# Patient Record
Sex: Female | Born: 1955 | Race: Black or African American | Hispanic: No | Marital: Married | Smoking: Former smoker
Health system: Southern US, Community
[De-identification: ages and names within clinical notes are randomized; demographics above are authoritative.]

## PROBLEM LIST (undated history)

## (undated) DIAGNOSIS — M1990 Unspecified osteoarthritis, unspecified site: Secondary | ICD-10-CM

## (undated) DIAGNOSIS — D649 Anemia, unspecified: Secondary | ICD-10-CM

## (undated) DIAGNOSIS — M719 Bursopathy, unspecified: Secondary | ICD-10-CM

## (undated) DIAGNOSIS — G4730 Sleep apnea, unspecified: Secondary | ICD-10-CM

## (undated) DIAGNOSIS — G629 Polyneuropathy, unspecified: Secondary | ICD-10-CM

## (undated) DIAGNOSIS — N939 Abnormal uterine and vaginal bleeding, unspecified: Secondary | ICD-10-CM

## (undated) DIAGNOSIS — IMO0001 Reserved for inherently not codable concepts without codable children: Secondary | ICD-10-CM

## (undated) DIAGNOSIS — E785 Hyperlipidemia, unspecified: Secondary | ICD-10-CM

## (undated) DIAGNOSIS — I10 Essential (primary) hypertension: Secondary | ICD-10-CM

## (undated) DIAGNOSIS — E119 Type 2 diabetes mellitus without complications: Secondary | ICD-10-CM

## (undated) DIAGNOSIS — I2510 Atherosclerotic heart disease of native coronary artery without angina pectoris: Secondary | ICD-10-CM

## (undated) DIAGNOSIS — E669 Obesity, unspecified: Secondary | ICD-10-CM

## (undated) DIAGNOSIS — M109 Gout, unspecified: Secondary | ICD-10-CM

## (undated) HISTORY — PX: TONSILLECTOMY: SUR1361

## (undated) HISTORY — PX: TUBAL LIGATION: SHX77

## (undated) HISTORY — PX: BREAST SURGERY: SHX581

## (undated) HISTORY — PX: BREAST BIOPSY: SHX20

## (undated) HISTORY — PX: DILATION AND CURETTAGE OF UTERUS: SHX78

---

## 2005-02-18 ENCOUNTER — Emergency Department: Payer: Self-pay | Admitting: Emergency Medicine

## 2005-04-04 ENCOUNTER — Emergency Department: Payer: Self-pay | Admitting: Emergency Medicine

## 2005-10-16 ENCOUNTER — Ambulatory Visit: Payer: Self-pay | Admitting: Internal Medicine

## 2006-01-29 ENCOUNTER — Ambulatory Visit: Payer: Self-pay | Admitting: Surgery

## 2006-10-27 ENCOUNTER — Other Ambulatory Visit: Payer: Self-pay

## 2006-10-27 ENCOUNTER — Emergency Department: Payer: Self-pay | Admitting: Emergency Medicine

## 2007-04-15 ENCOUNTER — Emergency Department: Payer: Self-pay | Admitting: Emergency Medicine

## 2007-05-07 ENCOUNTER — Emergency Department: Payer: Self-pay | Admitting: Unknown Physician Specialty

## 2008-03-12 ENCOUNTER — Emergency Department: Payer: Self-pay | Admitting: Emergency Medicine

## 2008-11-25 ENCOUNTER — Emergency Department: Payer: Self-pay | Admitting: Unknown Physician Specialty

## 2009-01-06 ENCOUNTER — Emergency Department: Payer: Self-pay | Admitting: Emergency Medicine

## 2009-03-30 ENCOUNTER — Ambulatory Visit: Payer: Self-pay

## 2009-10-21 ENCOUNTER — Inpatient Hospital Stay: Payer: Self-pay | Admitting: Internal Medicine

## 2010-04-13 ENCOUNTER — Ambulatory Visit: Payer: Self-pay | Admitting: Family Medicine

## 2011-04-16 ENCOUNTER — Emergency Department: Payer: Self-pay | Admitting: Emergency Medicine

## 2011-09-15 ENCOUNTER — Ambulatory Visit: Payer: Self-pay

## 2013-03-17 ENCOUNTER — Emergency Department: Payer: Self-pay | Admitting: Emergency Medicine

## 2013-03-17 LAB — BASIC METABOLIC PANEL
BUN: 20 mg/dL — ABNORMAL HIGH (ref 7–18)
Chloride: 104 mmol/L (ref 98–107)
Co2: 27 mmol/L (ref 21–32)
Creatinine: 0.95 mg/dL (ref 0.60–1.30)
EGFR (African American): >60
EGFR (Non-African Amer.): >60
Osmolality: 279 (ref 275–301)
Potassium: 3.6 mmol/L (ref 3.5–5.1)
Sodium: 138 mmol/L (ref 136–145)

## 2013-03-17 LAB — CBC
MCH: 22.8 pg — ABNORMAL LOW (ref 26.0–34.0)
MCHC: 32 g/dL (ref 32.0–36.0)
MCV: 71 fL — ABNORMAL LOW (ref 80–100)
Platelet: 301 10*3/uL (ref 150–440)
RBC: 4.56 10*6/uL (ref 3.80–5.20)

## 2013-06-05 ENCOUNTER — Ambulatory Visit: Payer: Self-pay | Admitting: Family Medicine

## 2014-02-04 ENCOUNTER — Ambulatory Visit: Payer: Self-pay | Admitting: Family Medicine

## 2014-11-09 ENCOUNTER — Ambulatory Visit: Payer: Self-pay | Admitting: Family Medicine

## 2014-11-27 ENCOUNTER — Ambulatory Visit: Payer: Self-pay | Admitting: Family Medicine

## 2015-07-20 ENCOUNTER — Ambulatory Visit
Admission: RE | Admit: 2015-07-20 | Discharge: 2015-07-20 | Disposition: A | Discharge status: Home / Self Care | Payer: BLUE CROSS/BLUE SHIELD | Source: Ambulatory Visit | Attending: Family Medicine | Admitting: Family Medicine

## 2015-07-20 ENCOUNTER — Other Ambulatory Visit: Payer: Self-pay | Admitting: Family Medicine

## 2015-07-20 DIAGNOSIS — M79605 Pain in left leg: Secondary | ICD-10-CM | POA: Insufficient documentation

## 2015-07-20 DIAGNOSIS — M25462 Effusion, left knee: Secondary | ICD-10-CM | POA: Insufficient documentation

## 2015-07-20 DIAGNOSIS — M179 Osteoarthritis of knee, unspecified: Secondary | ICD-10-CM | POA: Diagnosis not present

## 2015-11-19 ENCOUNTER — Encounter
Admission: RE | Admit: 2015-11-19 | Discharge: 2015-11-19 | Disposition: A | Discharge status: Home / Self Care | Payer: BLUE CROSS/BLUE SHIELD | Source: Ambulatory Visit | Attending: Obstetrics and Gynecology | Admitting: Obstetrics and Gynecology

## 2015-11-19 DIAGNOSIS — Z01810 Encounter for preprocedural cardiovascular examination: Secondary | ICD-10-CM | POA: Insufficient documentation

## 2015-11-19 HISTORY — DX: Anemia, unspecified: D64.9

## 2015-11-19 HISTORY — DX: Bursopathy, unspecified: M71.9

## 2015-11-19 HISTORY — DX: Obesity, unspecified: E66.9

## 2015-11-19 HISTORY — DX: Essential (primary) hypertension: I10

## 2015-11-19 HISTORY — DX: Reserved for inherently not codable concepts without codable children: IMO0001

## 2015-11-19 HISTORY — DX: Sleep apnea, unspecified: G47.30

## 2015-11-19 HISTORY — DX: Hyperlipidemia, unspecified: E78.5

## 2015-11-19 HISTORY — DX: Abnormal uterine and vaginal bleeding, unspecified: N93.9

## 2015-11-19 HISTORY — DX: Unspecified osteoarthritis, unspecified site: M19.90

## 2015-11-19 HISTORY — DX: Type 2 diabetes mellitus without complications: E11.9

## 2015-11-19 LAB — CBC
HEMATOCRIT: 32.5 % — AB (ref 35.0–47.0)
Hemoglobin: 10 g/dL — ABNORMAL LOW (ref 12.0–16.0)
MCH: 21.8 pg — AB (ref 26.0–34.0)
MCHC: 30.6 g/dL — AB (ref 32.0–36.0)
MCV: 71 fL — AB (ref 80.0–100.0)
Platelets: 239 10*3/uL (ref 150–440)
RBC: 4.58 MIL/uL (ref 3.80–5.20)
RDW: 16.2 % — AB (ref 11.5–14.5)
WBC: 10.1 10*3/uL (ref 3.6–11.0)

## 2015-11-19 LAB — COMPREHENSIVE METABOLIC PANEL
ALT: 12 U/L — ABNORMAL LOW (ref 14–54)
ANION GAP: 9 (ref 5–15)
AST: 14 U/L — ABNORMAL LOW (ref 15–41)
Albumin: 3.8 g/dL (ref 3.5–5.0)
Alkaline Phosphatase: 68 U/L (ref 38–126)
BILIRUBIN TOTAL: 0.4 mg/dL (ref 0.3–1.2)
BUN: 25 mg/dL — ABNORMAL HIGH (ref 6–20)
CO2: 25 mmol/L (ref 22–32)
Calcium: 8.8 mg/dL — ABNORMAL LOW (ref 8.9–10.3)
Chloride: 106 mmol/L (ref 101–111)
Creatinine, Ser: 0.86 mg/dL (ref 0.44–1.00)
Glucose, Bld: 150 mg/dL — ABNORMAL HIGH (ref 65–99)
POTASSIUM: 3.9 mmol/L (ref 3.5–5.1)
Sodium: 140 mmol/L (ref 135–145)
TOTAL PROTEIN: 7.4 g/dL (ref 6.5–8.1)

## 2015-11-19 LAB — TYPE AND SCREEN
ABO/RH(D): A POS
Antibody Screen: NEGATIVE

## 2015-11-19 LAB — ABO/RH: ABO/RH(D): A POS

## 2015-11-19 NOTE — Pre-Procedure Instructions (Signed)
Called Dr Randa NgoPiscitello regarding abnormal EKG. "Get medical clearance."  Unable to reach [facility] by phone faxed request for clearance.  Jefm Bryantalled, Nancy, and faxed medical clearance to Dr Edison PaceJackson's office.

## 2015-11-19 NOTE — Patient Instructions (Signed)
  Your procedure is scheduled on: 11/25/15 Thurs Report to Day Surgery.2nd floor medical mall  To find out your arrival time please call [phone] between 1PM - 3PM on 11/24/15 Wed  Remember: Instructions that are not followed completely may result in serious medical risk, up to and including death, or upon the discretion of your surgeon and anesthesiologist your surgery may need to be rescheduled.    _x___ 1. Do not eat food or drink liquids after midnight. No gum chewing or hard candies.     ____ 2. No Alcohol for 24 hours before or after surgery.   ____ 3. Bring all medications with you on the day of surgery if instructed.    __x_ 4. Notify your doctor if there is any change in your medical condition     (cold, fever, infections).     Do not wear jewelry, make-up, hairpins, clips or nail polish.  Do not wear lotions, powders, or perfumes. You may wear deodorant.  Do not shave 48 hours prior to surgery. Men may shave face and neck.  Do not bring valuables to the hospital.    [facility] is not responsible for any belongings or valuables.               Contacts, dentures or bridgework may not be worn into surgery.  Leave your suitcase in the car. After surgery it may be brought to your room.  For patients admitted to the hospital, discharge time is determined by your                treatment team.   Patients discharged the day of surgery will not be allowed to drive home.   Please read over the following fact sheets that you were given:      _x___ Take these medicines the morning of surgery with A SIP OF WATER:    1. gabapentin (NEURONTIN) 300 MG capsule  2. losartan (COZAAR) 100 MG tablet  3.   4.  5.  6.  ____ Fleet Enema (as directed)   ____ Use CHG Soap as directed  ____ Use inhalers on the day of surgery  ____ Stop metformin 2 days prior to surgery    __x__ Take 1/2 of usual insulin dose the night before surgery and none on the morning of surgery.  __x___Stop  Aspirin today  __x____ Stop Anti-inflammatories on Stop mobic today   ____ Stop supplements until after surgery.    __x__ Bring C-Pap to the hospital.

## 2015-11-24 NOTE — Pre-Procedure Instructions (Signed)
[facility] BEING SEEN [facility]/25/15 FOR CLEARANCE. NANCY AT DR Maudry MayhewJACKSONS AWARE AND SURGERY [facility] PENDING CLEARANCE

## 2015-11-25 ENCOUNTER — Encounter: Admission: RE | Payer: Self-pay | Source: Ambulatory Visit

## 2015-11-25 ENCOUNTER — Ambulatory Visit
Admission: RE | Admit: 2015-11-25 | Payer: BLUE CROSS/BLUE SHIELD | Source: Ambulatory Visit | Admitting: Obstetrics and Gynecology

## 2015-11-25 SURGERY — DILATATION AND CURETTAGE /HYSTEROSCOPY
Anesthesia: Choice

## 2016-01-28 ENCOUNTER — Encounter
Admission: RE | Admit: 2016-01-28 | Discharge: 2016-01-28 | Disposition: A | Discharge status: Home / Self Care | Payer: BLUE CROSS/BLUE SHIELD | Source: Ambulatory Visit | Attending: Obstetrics and Gynecology | Admitting: Obstetrics and Gynecology

## 2016-01-28 DIAGNOSIS — Z01810 Encounter for preprocedural cardiovascular examination: Secondary | ICD-10-CM | POA: Diagnosis present

## 2016-01-28 DIAGNOSIS — R9431 Abnormal electrocardiogram [ECG] [EKG]: Secondary | ICD-10-CM | POA: Diagnosis not present

## 2016-01-28 DIAGNOSIS — Z01812 Encounter for preprocedural laboratory examination: Secondary | ICD-10-CM | POA: Diagnosis present

## 2016-01-28 LAB — COMPREHENSIVE METABOLIC PANEL
ALBUMIN: 4.1 g/dL (ref 3.5–5.0)
ALT: 13 U/L — ABNORMAL LOW (ref 14–54)
ANION GAP: 6 (ref 5–15)
AST: 18 U/L (ref 15–41)
Alkaline Phosphatase: 74 U/L (ref 38–126)
BILIRUBIN TOTAL: 0.5 mg/dL (ref 0.3–1.2)
BUN: 36 mg/dL — ABNORMAL HIGH (ref 6–20)
CALCIUM: 9.6 mg/dL (ref 8.9–10.3)
CO2: 26 mmol/L (ref 22–32)
Chloride: 104 mmol/L (ref 101–111)
Creatinine, Ser: 0.95 mg/dL (ref 0.44–1.00)
GFR calc Af Amer: >60 mL/min (ref >60)
GLUCOSE: 89 mg/dL (ref 65–99)
POTASSIUM: 3.9 mmol/L (ref 3.5–5.1)
SODIUM: 136 mmol/L (ref 135–145)
TOTAL PROTEIN: 8.9 g/dL — AB (ref 6.5–8.1)

## 2016-01-28 LAB — CBC
HCT: 35.2 % (ref 35.0–47.0)
Hemoglobin: 10.9 g/dL — ABNORMAL LOW (ref 12.0–16.0)
MCH: 22.3 pg — ABNORMAL LOW (ref 26.0–34.0)
MCHC: 30.9 g/dL — ABNORMAL LOW (ref 32.0–36.0)
MCV: 72 fL — ABNORMAL LOW (ref 80.0–100.0)
PLATELETS: 254 10*3/uL (ref 150–440)
RBC: 4.89 MIL/uL (ref 3.80–5.20)
RDW: 16.6 % — AB (ref 11.5–14.5)
WBC: 13.4 10*3/uL — AB (ref 3.6–11.0)

## 2016-01-28 LAB — TYPE AND SCREEN
ABO/RH(D): A POS
Antibody Screen: NEGATIVE

## 2016-01-28 NOTE — Patient Instructions (Signed)
  Your procedure is scheduled on: February 10, 2016 (Thursday) To find out your arrival time please call [phone] between 1PM - 3PM on February 09, 2016 (Wednesday) Please Report to Day Surgery (Second Floor ) Medical Mall.  Remember: Instructions that are not followed completely may result in serious medical risk, up to and including death, or upon the discretion of your surgeon and anesthesiologist your surgery may need to be rescheduled.    __x__ 1. Do not eat food or drink liquids after midnight. No gum chewing or hard candies.     ____ 2. No Alcohol for 24 hours before or after surgery.   ____ 3. Bring all medications with you on the day of surgery if instructed.    _x_ 4. Notify your doctor if there is any change in your medical condition     (cold, fever, infections).     Do not wear jewelry, make-up, hairpins, clips or nail polish.  Do not wear lotions, powders, or perfumes. You may wear deodorant.  Do not shave 48 hours prior to surgery. Men may shave face and neck.  Do not bring valuables to the hospital.    [facility] is not responsible for any belongings or valuables.               Contacts, dentures or bridgework may not be worn into surgery.  Leave your suitcase in the car. After surgery it may be brought to your room.  For patients admitted to the hospital, discharge time is determined by your                treatment team.   Patients discharged the day of surgery will not be allowed to drive home.   Please read over the following fact sheets that you were given:   Surgical Site Infection Prevention   ____ Take these medicines the morning of surgery with A SIP OF WATER:    1. Losartan  2. Gabapentin      ____ Fleet Enema (as directed)   ____ Use CHG Soap as directed  ____ Use inhalers on the day of surgery  ____ Stop metformin 2 days prior to surgery    __x__ Take 1/2 of usual insulin dose the night before surgery and none on the morning of surgery.  (TAKE  ONE-HALF OF LEVEMIR INSULIN THE NIGHT BEFORE SURGERY, NO INSULIN THE MORNING OF SURGERY)   __x__ Stop Coumadin/Plavix/aspirin on (STOP ASPIRIN ONE WEEK BEFORE SURGERY)  __X__ Stop Anti-inflammatories on (STOP MELOXICAM ONE WEEK BEFORE SURGERY) NO NSAIDS) TYLENOL OK TO TAKE FOR PAIN IF NEEDED   __x__ Stop supplements until after surgery.  (STOP B-12 NOW)  __x__ Bring C-Pap to the hospital.

## 2016-01-31 NOTE — Pre-Procedure Instructions (Signed)
Cleared by dr s Welton Flakeskhan 12/14/15. Stress / echo on chart

## 2016-02-10 ENCOUNTER — Encounter: Admission: RE | Payer: Self-pay | Source: Ambulatory Visit

## 2016-02-10 ENCOUNTER — Ambulatory Visit
Admission: RE | Admit: 2016-02-10 | Payer: BLUE CROSS/BLUE SHIELD | Source: Ambulatory Visit | Admitting: Obstetrics and Gynecology

## 2016-02-10 SURGERY — DILATATION & CURETTAGE/HYSTEROSCOPY WITH MYOSURE
Anesthesia: Choice

## 2017-01-04 ENCOUNTER — Emergency Department
Admission: EM | Admit: 2017-01-04 | Discharge: 2017-01-04 | Disposition: A | Discharge status: Home / Self Care | Payer: BLUE CROSS/BLUE SHIELD | Attending: Emergency Medicine | Admitting: Emergency Medicine

## 2017-01-04 ENCOUNTER — Emergency Department: Payer: BLUE CROSS/BLUE SHIELD

## 2017-01-04 DIAGNOSIS — E119 Type 2 diabetes mellitus without complications: Secondary | ICD-10-CM | POA: Insufficient documentation

## 2017-01-04 DIAGNOSIS — Z7982 Long term (current) use of aspirin: Secondary | ICD-10-CM | POA: Diagnosis not present

## 2017-01-04 DIAGNOSIS — Z79899 Other long term (current) drug therapy: Secondary | ICD-10-CM | POA: Insufficient documentation

## 2017-01-04 DIAGNOSIS — I10 Essential (primary) hypertension: Secondary | ICD-10-CM | POA: Diagnosis not present

## 2017-01-04 DIAGNOSIS — Z794 Long term (current) use of insulin: Secondary | ICD-10-CM | POA: Insufficient documentation

## 2017-01-04 DIAGNOSIS — R600 Localized edema: Secondary | ICD-10-CM | POA: Diagnosis not present

## 2017-01-04 DIAGNOSIS — R0602 Shortness of breath: Secondary | ICD-10-CM | POA: Diagnosis present

## 2017-01-04 LAB — URINALYSIS, COMPLETE (UACMP) WITH MICROSCOPIC
BILIRUBIN URINE: NEGATIVE
Glucose, UA: NEGATIVE mg/dL
Ketones, ur: NEGATIVE mg/dL
Nitrite: POSITIVE — AB
Protein, ur: NEGATIVE mg/dL
SPECIFIC GRAVITY, URINE: 1.013 (ref 1.005–1.030)
pH: 5 (ref 5.0–8.0)

## 2017-01-04 LAB — BASIC METABOLIC PANEL
Anion gap: 7 (ref 5–15)
BUN: 15 mg/dL (ref 6–20)
CALCIUM: 8.8 mg/dL — AB (ref 8.9–10.3)
CHLORIDE: 107 mmol/L (ref 101–111)
CO2: 26 mmol/L (ref 22–32)
CREATININE: 0.78 mg/dL (ref 0.44–1.00)
GFR calc Af Amer: >60 mL/min (ref >60)
GFR calc non Af Amer: >60 mL/min (ref >60)
Glucose, Bld: 77 mg/dL (ref 65–99)
Potassium: 3.5 mmol/L (ref 3.5–5.1)
SODIUM: 140 mmol/L (ref 135–145)

## 2017-01-04 LAB — CBC
HCT: 30.2 % — ABNORMAL LOW (ref 35.0–47.0)
Hemoglobin: 9.8 g/dL — ABNORMAL LOW (ref 12.0–16.0)
MCH: 23.2 pg — AB (ref 26.0–34.0)
MCHC: 32.6 g/dL (ref 32.0–36.0)
MCV: 71.2 fL — AB (ref 80.0–100.0)
PLATELETS: 282 10*3/uL (ref 150–440)
RBC: 4.25 MIL/uL (ref 3.80–5.20)
RDW: 16.6 % — AB (ref 11.5–14.5)
WBC: 10.7 10*3/uL (ref 3.6–11.0)

## 2017-01-04 LAB — FIBRIN DERIVATIVES D-DIMER (ARMC ONLY): Fibrin derivatives D-dimer (ARMC): 789 — ABNORMAL HIGH (ref 0–499)

## 2017-01-04 LAB — TROPONIN I: Troponin I: <0.03 ng/mL (ref <0.03)

## 2017-01-04 LAB — BRAIN NATRIURETIC PEPTIDE: B Natriuretic Peptide: 95 pg/mL (ref 0.0–100.0)

## 2017-01-04 MED ORDER — FUROSEMIDE 10 MG/ML IJ SOLN
40.0000 mg | Freq: Once | INTRAMUSCULAR | Status: AC
  Administered 2017-01-04: 40 mg via INTRAVENOUS
  Filled 2017-01-04: qty 4

## 2017-01-04 MED ORDER — FUROSEMIDE 20 MG PO TABS: 20.0000 mg | ORAL_TABLET | Freq: Two times a day (BID) | ORAL | 0 refills | Status: DC

## 2017-01-04 NOTE — Discharge Instructions (Signed)
Please increase your iron tablets 2-3 times a day. Please weigh yourself unbearable basis and continue all of her other current medications. Please contact her primary physician and your cardiologist. We'll recommend outpatient echocardiogram.

## 2017-01-04 NOTE — ED Notes (Signed)
Lab called and informed of add-on urine culture.  Lab verbalized understanding of this information.

## 2017-01-04 NOTE — ED Provider Notes (Signed)
Time Seen: Approximately 0853  I have reviewed the triage notes  Chief Complaint: Leg Swelling and Shortness of Breath   History of Present Illness: Julie Sawyer is a 61 y.o. female who presents with diffuse increasing edema over the past week. She did see her primary physician and had some Lasix IV and that was prescribed outpatient Lasix 20 mg a day. She feels a Lasix really has not caused any improvement. She's had some mild shortness of breath with exertion. Increased swelling in both lower extremities and now has noticed some increased swelling in her hands. She denies any persistent chest pain, diaphoresis, nausea or vomiting. No history of blood clots in the legs or lungs. Tension was on hydrochlorothiazide which was stopped remotely. Patient states she had a stress echocardiogram performed approximately 2 years ago and has no history of cardiovascular disease.  Past Medical History:  Diagnosis Date  . Anemia   . Arthritis   . Bursitis   . Diabetes mellitus without complication (HCC)   . Elevated lipids   . Hypertension   . Obesity   . Shortness of breath dyspnea   . Sleep apnea    use CPAP  . Vaginal bleeding     There are no active problems to display for this patient.   Past Surgical History:  Procedure Laterality Date  . BREAST SURGERY Left   . CESAREAN SECTION    . TONSILLECTOMY    . TUBAL LIGATION      Past Surgical History:  Procedure Laterality Date  . BREAST SURGERY Left   . CESAREAN SECTION    . TONSILLECTOMY    . TUBAL LIGATION      Current Outpatient Rx  . Order #: [id number] Class: Historical Med  . Order #: [id number] Class: Historical Med  . Order #: [id number] Class: Historical Med  . Order #: [id number] Class: Historical Med  . Order #: [id number] Class: Historical Med  . Order #: [id number] Class: Historical Med  . Order #: [id number] Class: Historical Med  . Order #: [id number] Class: Historical Med  . Order #: [id number] Class: Historical Med  .  Order #: [id number] Class: Historical Med    Allergies:  Patient has no known allergies.  Family History: No family history on file.  Social History: Social History  Substance Use Topics  . Smoking status: Never Smoker  . Smokeless tobacco: Never Used  . Alcohol use No     Review of Systems:   10 point review of systems was performed and was otherwise negative:  Constitutional: No fever Eyes: No visual disturbances ENT: No sore throat, ear pain Cardiac: No chest pain Respiratory: Shortness of breath with exertion without wheezing or stridor Abdomen: No abdominal pain, no vomiting, No diarrhea Endocrine: No weight loss, No night sweats Extremities: Increasing bilateral peripheral edema hands and feet Skin: No rashes, easy bruising Neurologic: No focal weakness, trouble with speech or swollowing Urologic: No dysuria, Hematuria, or urinary frequency   Physical Exam:  ED Triage Vitals  Enc Vitals Group     BP 01/04/17 0843 (!) 148/55     Pulse Rate 01/04/17 0843 74     Resp 01/04/17 0843 19     Temp 01/04/17 0843 97.7 F (36.5 C)     Temp Source 01/04/17 0843 Oral     SpO2 01/04/17 0843 97 %     Weight 01/04/17 0844 (!) 302 lb (137 kg)     Height 01/04/17 0844 5' (1.524 m)     Head Circumference --  Peak Flow --      Pain Score 01/04/17 0847 2     Pain Loc --      Pain Edu? --      Excl. in GC? --     General: Awake , Alert , and Oriented times 3; GCS 15 Head: Normal cephalic , atraumatic Eyes: Pupils equal , round, reactive to light Nose/Throat: No nasal drainage, patent upper airway without erythema or exudate.  Neck: Supple, Full range of motion, No anterior adenopathy or palpable thyroid masses Lungs: Clear to ascultation without wheezes , rhonchi, or rales Heart: Regular rate, regular rhythm without murmurs , gallops , or rubs Abdomen: Obese, Soft, non tender without rebound, guarding , or rigidity; bowel sounds positive and symmetric in all 4  quadrants. No organomegaly .        Extremities: Bilateral circumferential edema with negative Homans sign. Mild pitting at 1-2+. Neurologic: normal ambulation, Motor symmetric without deficits, sensory intact Skin: warm, dry, no rashes   Labs:   All laboratory work was reviewed including any pertinent negatives or positives listed below:  Labs Reviewed  BASIC METABOLIC PANEL - Abnormal; Notable for the following:       Result Value   Calcium 8.8 (*)    All other components within normal limits  CBC - Abnormal; Notable for the following:    Hemoglobin 9.8 (*)    HCT 30.2 (*)    MCV 71.2 (*)    MCH 23.2 (*)    RDW 16.6 (*)    All other components within normal limits  TROPONIN I  BRAIN NATRIURETIC PEPTIDE  FIBRIN DERIVATIVES D-DIMER ([facility] ONLY)  URINALYSIS, COMPLETE (UACMP) WITH MICROSCOPIC  HEPATIC FUNCTION PANEL    EKG ED ECG REPORT I, Jennye Moccasin, the attending physician, personally viewed and interpreted this ECG.  Date: 01/04/2017 EKG Time:0850 Rate: 73 Rhythm: normal sinus rhythm QRS Axis: normal Intervals: normal ST/T Wave abnormalities: normal Conduction Disturbances: none Narrative Interpretation: unremarkable poor R-wave progression in the anterior leads with no acute ischemic changes.  Radiology:  "Dg Chest 2 View  Result Date: 01/04/2017 CLINICAL DATA:  Two days of shortness of breath, former smoker. History of diabetes and hypertension. EXAM: CHEST  2 VIEW COMPARISON:  Chest x-ray of Mar 17, 2013 FINDINGS: The lungs are mildly hyperinflated. The interstitial markings are coarse. There is subtle increased density in the retrocardiac region which is not entirely new. The cardiac silhouette is mildly enlarged. The pulmonary vascularity is prominent centrally but is stable. There is faint calcification in the wall of the aortic arch. The mediastinum is normal in width. IMPRESSION: Chronic hyperinflation suggests reactive airway disease or COPD. Coarse lung  markings in the retrocardiac region consistent with subsegmental atelectasis or early pneumonia. Mild interstitial prominence bilaterally may reflect bronchitic change or mild interstitial edema. Followup PA and lateral chest X-ray is recommended in 3-4 weeks following trial of antibiotic therapy to ensure resolution and exclude underlying malignancy. Thoracic aortic atherosclerosis. Electronically Signed   By: David  [place] M.D.   On: 01/04/2017 09:39  " The patient had bilateral Doppler studies of the lower extremities which showed a Baker's cyst but otherwise no evidence of deep venous thrombosis I personally reviewed the radiologic studies    ED Course:  Patient's stay here was uneventful and she was given 40 mg of IV Lasix once we verify normal renal function. She has put out a large amount of urine here in emergency department and feels symptomatically improved. Her pulse ox,  etc. remain stable. Review of her chest x-ray to me shows some mild encephalization. I don't see any evidence clinically of pneumonia, masses, etc. She may have some bronchitic change but has no fever or any evidence of influenza. She also has no evidence of myocarditis or significantly enlarged cardiac silhouette. She will require further outpatient evaluation I placed her on furosemide 20 mg twice a day and we discussed follow-up with cardiology for repeat echocardiogram. At this time I felt she did not require inpatient management.  the patient was also advised to increase her dose sulfate to 3 times a day she does have some mild anemia.   Assessment: * Acute pulmonary edema Acute peripheral edema      Plan:  Outpatient Patient was advised to return immediately if condition worsens. Patient was advised to follow up with their primary care physician or other specialized physicians involved in their outpatient care. The patient and/or family member/power of attorney had laboratory results reviewed at the bedside.  All questions and concerns were addressed and appropriate discharge instructions were distributed by the nursing staff.           Jennye Moccasin, MD 01/04/17 [phone]

## 2017-01-04 NOTE — ED Triage Notes (Signed)
Pt arrives today with increased shortness of breath and bilateral peripheral edema

## 2017-01-06 LAB — URINE CULTURE: Culture: >=100000 — AB

## 2017-01-24 ENCOUNTER — Other Ambulatory Visit: Payer: Self-pay | Admitting: Family Medicine

## 2017-01-24 ENCOUNTER — Ambulatory Visit
Admission: RE | Admit: 2017-01-24 | Discharge: 2017-01-24 | Disposition: A | Discharge status: Home / Self Care | Payer: BLUE CROSS/BLUE SHIELD | Source: Ambulatory Visit | Attending: Family Medicine | Admitting: Family Medicine

## 2017-01-24 DIAGNOSIS — I517 Cardiomegaly: Secondary | ICD-10-CM | POA: Diagnosis not present

## 2017-01-24 DIAGNOSIS — R0602 Shortness of breath: Secondary | ICD-10-CM | POA: Insufficient documentation

## 2017-03-12 IMAGING — CR DG CHEST 2V
2 series · 2 of 2 positions shown · non-contrast
Comparison: Chest x-ray of March 17, 2013

CLINICAL DATA: Two days of shortness of breath, former smoker.
History of diabetes and hypertension.

EXAM:
CHEST  2 VIEW

[chest pa]
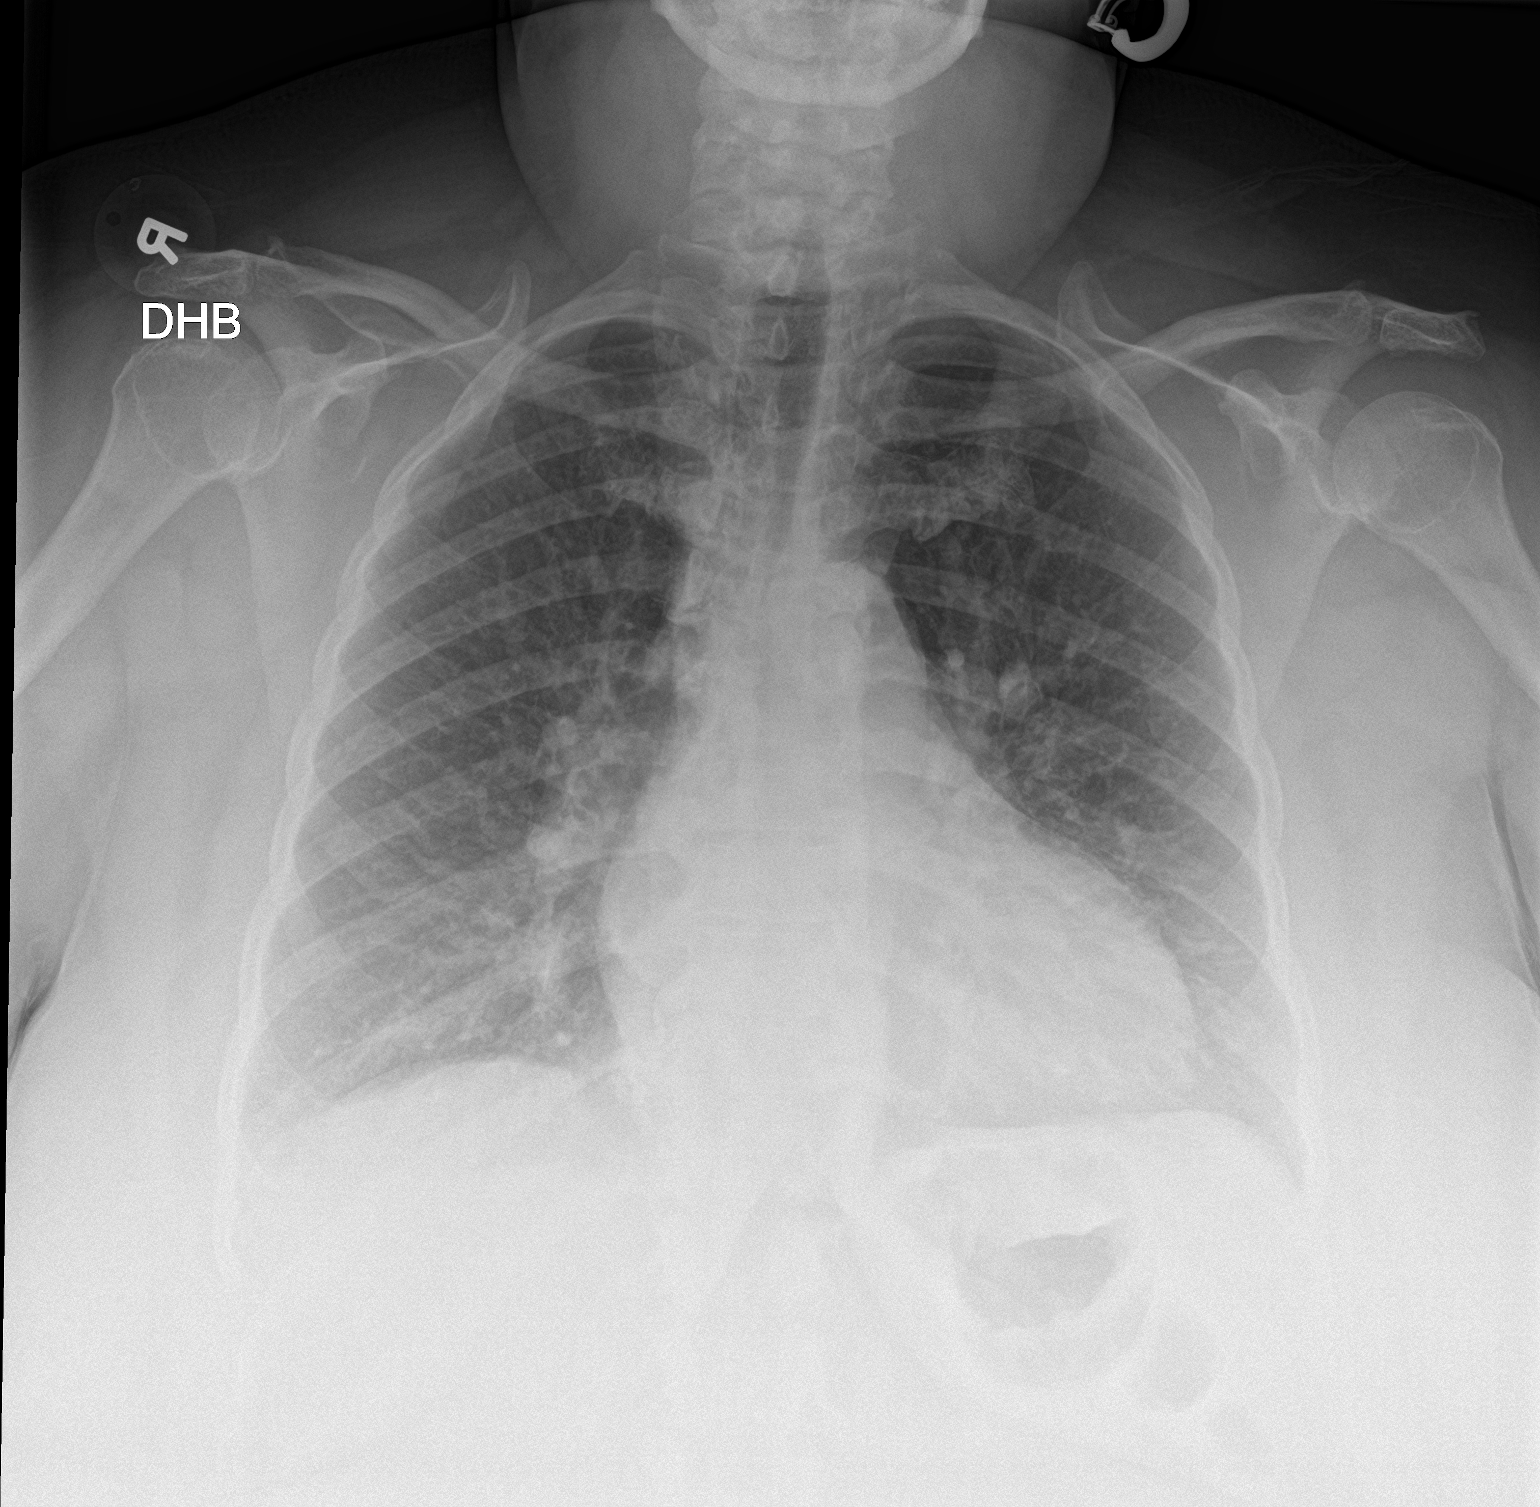

[chest lat]
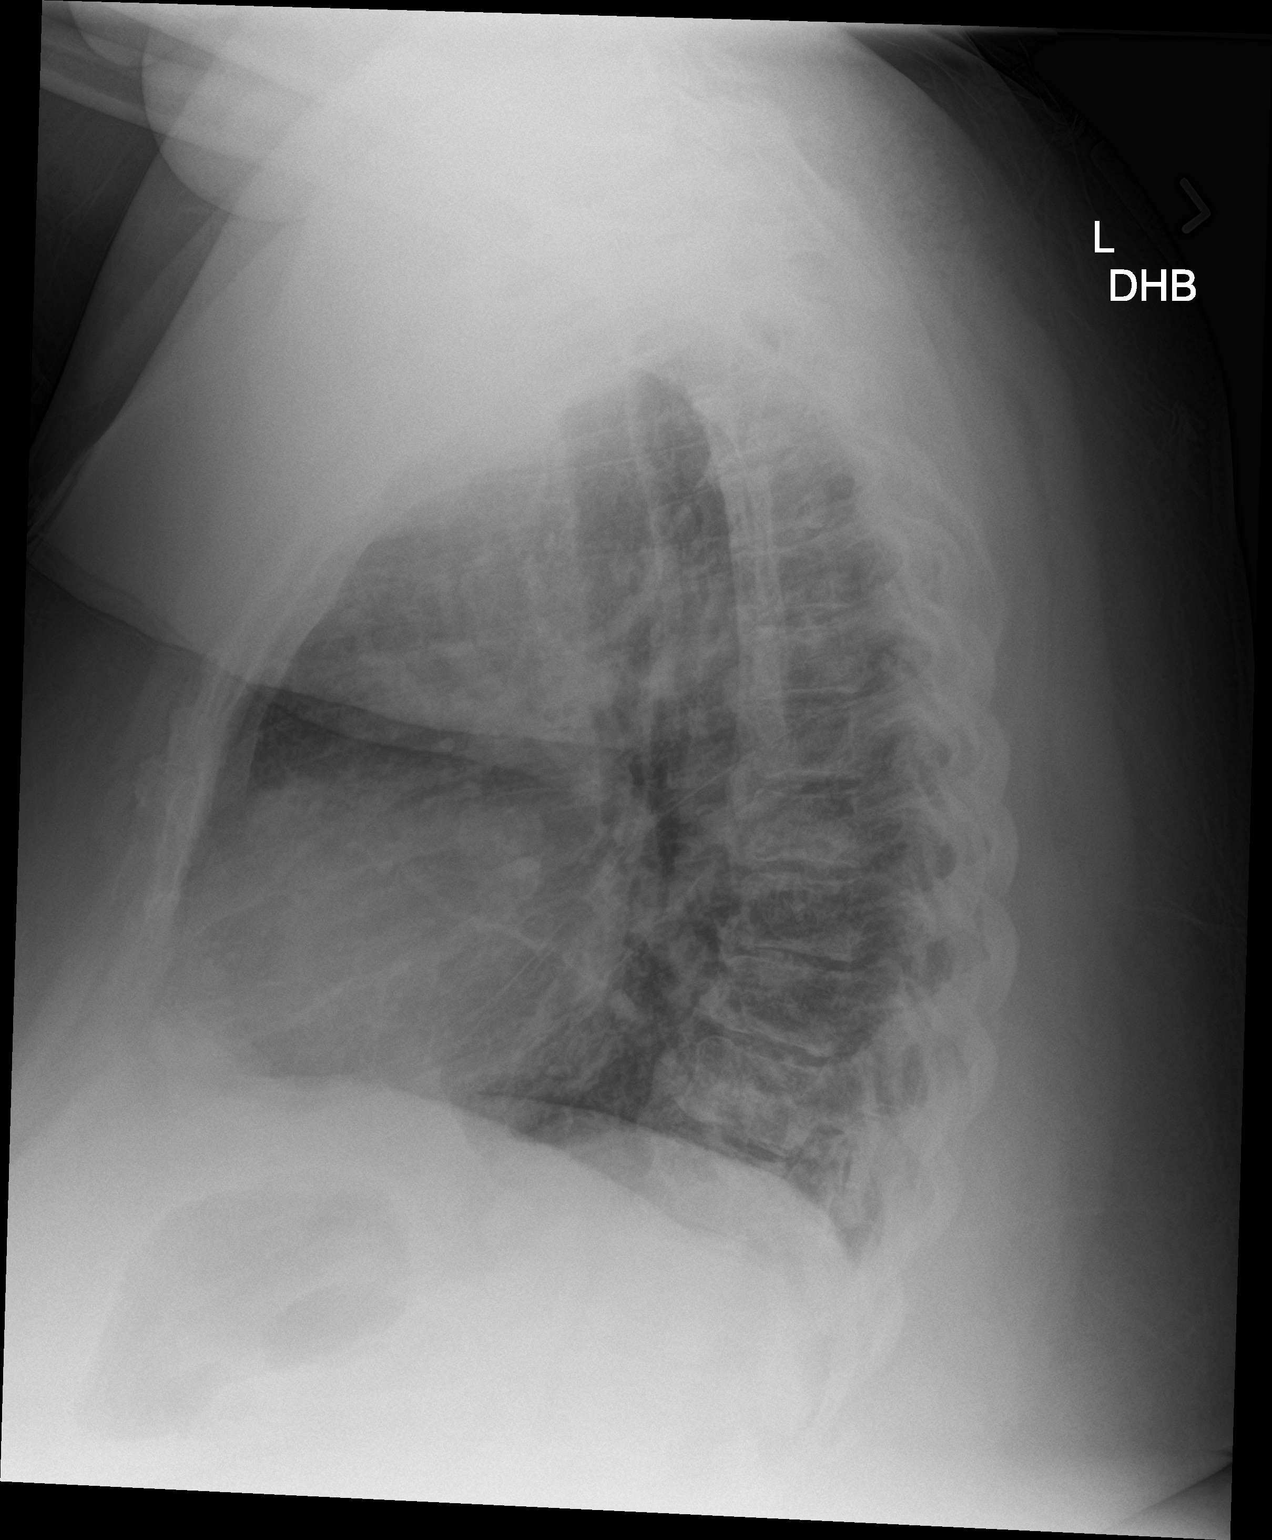

[2 of 2 positions shown; findings below may reference images not displayed]

FINDINGS: The lungs are mildly hyperinflated. The interstitial markings are
coarse. There is subtle increased density in the retrocardiac region
which is not entirely new. The cardiac silhouette is mildly
enlarged. The pulmonary vascularity is prominent centrally but is
stable. There is faint calcification in the wall of the aortic arch.
The mediastinum is normal in width.
IMPRESSION: Chronic hyperinflation suggests reactive airway disease or COPD.
Coarse lung markings in the retrocardiac region consistent with
subsegmental atelectasis or early pneumonia. Mild interstitial
prominence bilaterally may reflect bronchitic change or mild
interstitial edema. Followup PA and lateral chest X-ray is
recommended in 3-4 weeks following trial of antibiotic therapy to
ensure resolution and exclude underlying malignancy.

Thoracic aortic atherosclerosis.

## 2017-03-14 ENCOUNTER — Other Ambulatory Visit: Payer: Self-pay | Admitting: Family Medicine

## 2017-03-14 DIAGNOSIS — N6320 Unspecified lump in the left breast, unspecified quadrant: Secondary | ICD-10-CM

## 2017-04-03 ENCOUNTER — Other Ambulatory Visit: Payer: BLUE CROSS/BLUE SHIELD

## 2017-04-17 ENCOUNTER — Ambulatory Visit (INDEPENDENT_AMBULATORY_CARE_PROVIDER_SITE_OTHER): Payer: BLUE CROSS/BLUE SHIELD | Admitting: Obstetrics and Gynecology

## 2017-04-17 ENCOUNTER — Encounter: Payer: Self-pay | Admitting: Obstetrics and Gynecology

## 2017-04-17 VITALS — BP 138/82 | HR 68 | Ht 59.0 in | Wt 294.0 lb

## 2017-04-17 DIAGNOSIS — N950 Postmenopausal bleeding: Secondary | ICD-10-CM | POA: Diagnosis not present

## 2017-04-17 DIAGNOSIS — N840 Polyp of corpus uteri: Secondary | ICD-10-CM | POA: Diagnosis not present

## 2017-04-17 DIAGNOSIS — N8501 Benign endometrial hyperplasia: Secondary | ICD-10-CM | POA: Diagnosis not present

## 2017-04-17 NOTE — Progress Notes (Signed)
Obstetrics & Gynecology Office Visit   Chief Complaint:  Chief Complaint  Patient presents with  . Follow-up   History of Present Illness: 61 y.o. female postmenopausal female who was seen by me last year and was diagnosed on endometrial biopsy with simple endometrial hyperplasia without atypia, possibly arising in the setting of a polyp. It was recommended to her to have a hysteroscopy, dilation and curettage with polypectomy for definitive diagnosis. She continued to cancel her surgery and has not been back for about a year.  She returns saying she has had no bleeding since she last saw me until about two weeks ago where she had some spotting on the toilet tissue. She has had no bleeding since that time. Denies bloating, new constipation, early satiety.    Review of Systems: Review of Systems  Constitutional: Negative.   HENT: Negative.   Eyes: Negative.   Respiratory: Negative.   Cardiovascular: Negative.   Gastrointestinal: Negative.   Genitourinary: Negative.   Musculoskeletal: Negative.   Skin: Negative.   Neurological: Negative.   Psychiatric/Behavioral: Negative.     Past Medical History:  Past Medical History:  Diagnosis Date  . Anemia   . Arthritis   . Bursitis   . Diabetes mellitus without complication (HCC)   . Elevated lipids   . Hypertension   . Obesity   . Shortness of breath dyspnea   . Sleep apnea    use CPAP  . Vaginal bleeding     Past Surgical History:  Procedure Laterality Date  . BREAST SURGERY Left   . CESAREAN SECTION    . TONSILLECTOMY    . TUBAL LIGATION      Gynecologic History: No LMP recorded. Patient is postmenopausal.  Obstetric History: [id number]  Family History  Problem Relation Age of Onset  . Adopted: Yes    Social History   Social History  . Marital status: Married    Spouse name: N/A  . Number of children: N/A  . Years of education: N/A   Occupational History  . Not on file.   Social History Main Topics  .  Smoking status: Never Smoker  . Smokeless tobacco: Never Used  . Alcohol use No  . Drug use: No  . Sexual activity: Not on file   Other Topics Concern  . Not on file   Social History Narrative  . No narrative on file    Allergies: No Known Allergies  Medications:   Medication Sig Start Date End Date Taking? Authorizing Provider  acyclovir (ZOVIRAX) 400 MG tablet Take 400 mg by mouth 2 (two) times daily as needed.    [provider]  aspirin 81 MG tablet Take 81 mg by mouth daily.    [provider]  ferrous sulfate 325 (65 FE) MG EC tablet Take 325 mg by mouth 2 (two) times daily with a meal.     [provider]  fluticasone (FLONASE) 50 MCG/ACT nasal spray Place 2 sprays into both nostrils daily as needed for allergies or rhinitis.    [provider]  furosemide (LASIX) 20 MG tablet Take 1 tablet (20 mg total) by mouth 2 (two) times daily. 01/04/17 01/04/18  Jennye Moccasin, MD  gabapentin (NEURONTIN) 300 MG capsule Take 300 mg by mouth 2 (two) times daily.     [provider]  glipiZIDE (GLUCOTROL XL) 10 MG 24 hr tablet Take 10 mg by mouth daily with breakfast.    [provider]  insulin detemir (LEVEMIR) 100 UNIT/ML  injection Inject 60 Units into the skin 2 (two) times daily.     [provider]  losartan (COZAAR) 100 MG tablet Take 100 mg by mouth daily.    [provider]  NOVOLOG FLEXPEN 100 UNIT/ML FlexPen Inject 2 Units into the skin daily before supper. 10/17/16   [provider]  simvastatin (ZOCOR) 40 MG tablet Take 40 mg by mouth daily at 6 PM.    [provider]    Physical Exam BP 138/82   Pulse 68   Ht 4\' 11"  (1.499 m)   Wt 294 lb (133.4 kg)   BMI 59.38 kg/m  No LMP recorded. Patient is postmenopausal. Physical Exam  Constitutional: She is oriented to person, place, and time and well-developed, well-nourished, and in no distress. No distress.  HENT:  Head: Normocephalic and  atraumatic.  Eyes: Conjunctivae are normal. No scleral icterus.  Cardiovascular: Normal rate and regular rhythm.   Pulmonary/Chest: Breath sounds normal.  Abdominal: Bowel sounds are normal. She exhibits no distension. There is no tenderness. There is no rebound and no guarding.  Morbidly obese  Genitourinary: Uterus normal and cervix normal.  Genitourinary Comments: Pelvic exam limited by body habitus.   Endometrial biopsy performed. See note.   Neurological: She is alert and oriented to person, place, and time.  Psychiatric: Mood, affect and judgment normal.    Endometrial Biopsy After discussion with the patient regarding her abnormal uterine bleeding I recommended that she proceed with an endometrial biopsy for further diagnosis. The risks, benefits, alternatives, and indications for an endometrial biopsy were discussed with the patient in detail. She understood the risks including infection, bleeding, cervical laceration and uterine perforation.  Verbal consent was obtained.   PROCEDURE NOTE:  Pipelle endometrial biopsy was performed using aseptic technique with iodine preparation.  The uterus was sounded to a length of 8.5 cm.  Adequate sampling was obtained with minimal blood loss.  The patient tolerated the procedure well.  Disposition will be pending pathology.  Female chaperone present for pelvic and breast  portions of the physical exam  Assessment: 61 y.o. [id number] with postmenopausal bleeding, simple endometrial hyperplasia without atypia, endometrial polyp.  Plan: Endometrial biopsy today.   If normal, will get pelvic ultrasound.   Discussed importance of follow up for postmenopausal bleeding and risks of cancer and expectation of success of treatment, if treated early and much poorer prognosis if treatment delayed.  Thomasene MohairStephen , MD 04/17/2017 10:49 AM

## 2017-04-19 ENCOUNTER — Other Ambulatory Visit: Payer: Self-pay | Admitting: Family Medicine

## 2017-04-19 ENCOUNTER — Ambulatory Visit
Admission: RE | Admit: 2017-04-19 | Discharge: 2017-04-19 | Disposition: A | Discharge status: Home / Self Care | Payer: BLUE CROSS/BLUE SHIELD | Source: Ambulatory Visit | Attending: Family Medicine | Admitting: Family Medicine

## 2017-04-19 DIAGNOSIS — M79672 Pain in left foot: Secondary | ICD-10-CM

## 2017-04-19 DIAGNOSIS — M19012 Primary osteoarthritis, left shoulder: Secondary | ICD-10-CM | POA: Insufficient documentation

## 2017-04-19 DIAGNOSIS — M25512 Pain in left shoulder: Secondary | ICD-10-CM

## 2017-04-19 LAB — PATHOLOGY

## 2017-04-26 ENCOUNTER — Ambulatory Visit
Admission: RE | Admit: 2017-04-26 | Discharge: 2017-04-26 | Disposition: A | Discharge status: Home / Self Care | Payer: BLUE CROSS/BLUE SHIELD | Source: Ambulatory Visit | Attending: Family Medicine | Admitting: Family Medicine

## 2017-04-26 DIAGNOSIS — N6320 Unspecified lump in the left breast, unspecified quadrant: Secondary | ICD-10-CM

## 2017-05-01 ENCOUNTER — Telehealth: Payer: Self-pay | Admitting: Obstetrics and Gynecology

## 2017-05-01 NOTE — Telephone Encounter (Signed)
Attempted to contact patient regarding her results. Her mobile number is busy with several tries.  I also called her home number and spoke with a gentleman who informed me that she was not at home and that her mobile number would be best

## 2017-05-03 ENCOUNTER — Encounter: Payer: Self-pay | Admitting: Obstetrics and Gynecology

## 2017-06-11 ENCOUNTER — Telehealth: Payer: Self-pay

## 2017-06-11 ENCOUNTER — Telehealth: Payer: Self-pay | Admitting: Obstetrics and Gynecology

## 2017-06-11 DIAGNOSIS — N840 Polyp of corpus uteri: Secondary | ICD-10-CM

## 2017-06-11 DIAGNOSIS — N8501 Benign endometrial hyperplasia: Secondary | ICD-10-CM

## 2017-06-11 DIAGNOSIS — N950 Postmenopausal bleeding: Secondary | ICD-10-CM

## 2017-06-11 NOTE — Telephone Encounter (Signed)
Called the patient back.  Her cell phone number was incorrect in our system. I have corrected in our system now. Discussed her results. Discussed that I am concerned that she has risk factors for developing cancer.  She also had fragments of a polyp, which could cause further bleeding.  Though she is not someone who should be taken to the OR lightly, I believe a hysteroscopy, D&C, polypectomy under the minimal amount of anesthesia and the minimal amount of fluids, would be best for her to further assess her bleeding and remove her polyp. I spent about 15 minutes explaining how given her findings, there is a suggestion that in her body there is an abundance of estrogen and she could develop cancer, if not balanced. She voiced understanding that I am worried that without treatment she could develop cancer. Also, given her findings, I do not believe I have an adequate assessment of her endometrium and have recommended the above surgery both for diagnosis and treatment (polyp).  She agrees. Will schedule.

## 2017-06-11 NOTE — Telephone Encounter (Signed)
Pt calling to f/u up on information re procedure he was considering for her.  [phone]

## 2017-06-11 NOTE — Telephone Encounter (Signed)
-----   Message from Conard NovakStephen D Jackson, MD sent at 06/11/2017 11:45 AM EDT ----- Regarding: Schedule Surgery Surgery Booking Request Patient Full Name: Julie Sawyer   MRN: [id number]  DOB: 1956-05-16  Surgeon: Thomasene MohairStephen Jackson, MD  Requested Surgery Date and Time: in the next month or so. Primary Diagnosis and Code: 1) postmenopausal bleeding (N95.0), 2) simple hyperplasia without atypia (N85.01), 3) endometrial polyp (N84.0) Secondary Diagnosis and Code:  Surgical Procedure: hysteroscopy, dilation and curettage, endometrial polypectomy L&D Notification: No Admission Status: same day surgery Length of Surgery: 30 minutes Special Case Needs: myosure H&P: tbd (date) Phone Interview or Office Pre-Admit: pre-admit Interpreter: n/a Language: english Medical Clearance: YES.  Please have patient contact PCP or cardiologist for medical/cardiac clearance for this procedure.  Special Scheduling Instructions: none

## 2017-06-11 NOTE — Telephone Encounter (Signed)
Patient is scheduled for H&P on 07/31/17@ 8:10am w/ Pre-admit Testing following, and OR on 08/07/17. Patient chose to have surgery in September due to work. Patient is aware Dr. Jean RosenthalJackson requests medical/cardiac clearance from her pcp or cardiologist. Per patient, she saw Dr. Park BreedKahn @ [facility] last year and will call his office. Patient was given my ext.

## 2017-06-19 NOTE — Telephone Encounter (Signed)
Pt has appt 9/18 with SDJ. Completing task.

## 2017-07-31 ENCOUNTER — Encounter: Payer: Self-pay | Admitting: Obstetrics and Gynecology

## 2017-07-31 ENCOUNTER — Encounter
Admission: RE | Admit: 2017-07-31 | Discharge: 2017-07-31 | Disposition: A | Discharge status: Home / Self Care | Payer: BLUE CROSS/BLUE SHIELD | Source: Ambulatory Visit | Attending: Obstetrics and Gynecology | Admitting: Obstetrics and Gynecology

## 2017-07-31 ENCOUNTER — Ambulatory Visit (INDEPENDENT_AMBULATORY_CARE_PROVIDER_SITE_OTHER): Payer: BLUE CROSS/BLUE SHIELD | Admitting: Obstetrics and Gynecology

## 2017-07-31 VITALS — BP 152/96 | Ht 59.0 in | Wt 296.0 lb

## 2017-07-31 DIAGNOSIS — N950 Postmenopausal bleeding: Secondary | ICD-10-CM

## 2017-07-31 DIAGNOSIS — N840 Polyp of corpus uteri: Secondary | ICD-10-CM

## 2017-07-31 DIAGNOSIS — Z01818 Encounter for other preprocedural examination: Secondary | ICD-10-CM | POA: Diagnosis present

## 2017-07-31 DIAGNOSIS — N8501 Benign endometrial hyperplasia: Secondary | ICD-10-CM

## 2017-07-31 LAB — CBC
HEMATOCRIT: 33 % — AB (ref 35.0–47.0)
HEMOGLOBIN: 10.5 g/dL — AB (ref 12.0–16.0)
MCH: 22.2 pg — ABNORMAL LOW (ref 26.0–34.0)
MCHC: 31.9 g/dL — AB (ref 32.0–36.0)
MCV: 69.5 fL — ABNORMAL LOW (ref 80.0–100.0)
Platelets: 258 10*3/uL (ref 150–440)
RBC: 4.74 MIL/uL (ref 3.80–5.20)
RDW: 17.6 % — ABNORMAL HIGH (ref 11.5–14.5)
WBC: 10.3 10*3/uL (ref 3.6–11.0)

## 2017-07-31 LAB — TYPE AND SCREEN
ABO/RH(D): A POS
Antibody Screen: NEGATIVE

## 2017-07-31 LAB — COMPREHENSIVE METABOLIC PANEL
ALBUMIN: 3.7 g/dL (ref 3.5–5.0)
ALK PHOS: 62 U/L (ref 38–126)
ALT: 17 U/L (ref 14–54)
AST: 20 U/L (ref 15–41)
Anion gap: 10 (ref 5–15)
BILIRUBIN TOTAL: 0.3 mg/dL (ref 0.3–1.2)
BUN: 21 mg/dL — AB (ref 6–20)
CALCIUM: 9.1 mg/dL (ref 8.9–10.3)
CO2: 26 mmol/L (ref 22–32)
Chloride: 107 mmol/L (ref 101–111)
Creatinine, Ser: 0.82 mg/dL (ref 0.44–1.00)
GFR calc Af Amer: >60 mL/min (ref >60)
GFR calc non Af Amer: >60 mL/min (ref >60)
GLUCOSE: 78 mg/dL (ref 65–99)
Potassium: 3.4 mmol/L — ABNORMAL LOW (ref 3.5–5.1)
Sodium: 143 mmol/L (ref 135–145)
TOTAL PROTEIN: 7.6 g/dL (ref 6.5–8.1)

## 2017-07-31 NOTE — Progress Notes (Signed)
Preoperative History and Physical  Julie Sawyer is a 61 y.o. [id number] here for surgical management of postmenopausal bleeding.   The patient has been cleared by her PCP for surgery.  History of Present Illness: 61 y.o. female postmenopausal female who was seen by me last year and was diagnosed on endometrial biopsy with simple endometrial hyperplasia without atypia, possibly arising in the setting of a polyp. It was recommended to her to have a hysteroscopy, dilation and curettage with polypectomy for definitive diagnosis. She continued to cancel her surgery and has not been back for about a year.  She returns saying she has had no bleeding since she last saw me until about two weeks ago where she had some spotting on the toilet tissue. She has had no bleeding since that time. Denies bloating, new constipation, early satiety.   Repeat endometrial biopsy this year in June shows similar pathology.    Proposed surgery: hysteroscopy, dilation and curettage, polypectomy  Past Medical History:  Diagnosis Date  . Anemia   . Arthritis   . Bursitis   . Diabetes mellitus without complication (HCC)   . Elevated lipids   . Hypertension   . Obesity   . Shortness of breath dyspnea   . Sleep apnea    use CPAP  . Vaginal bleeding    Past Surgical History:  Procedure Laterality Date  . BREAST BIOPSY Left    neg  . BREAST SURGERY Left   . CESAREAN SECTION    . TONSILLECTOMY    . TUBAL LIGATION     OB History  Gravida Para Term Preterm AB Living  SAB TAB Ectopic Multiple Live Births    1          # Outcome Date GA Lbr Len/2nd Weight Sex Delivery Anes PTL Lv  3 Preterm 06/11/88 [redacted]w[redacted]d  2 lb 1.6 oz (0.953 kg) F CS-Classical     2 Term 08/28/77   5 lb 4.8 oz (2.404 kg) M Vag-Spont     1 TAB             Patient denies any other pertinent gynecologic issues.   Current Outpatient Prescriptions on File Prior to Visit  Medication Sig Dispense Refill  . amLODipine (NORVASC) 10 MG  tablet Take 10 mg by mouth daily.  0  . aspirin 81 MG tablet Take 81 mg by mouth daily.    . ferrous sulfate 325 (65 FE) MG EC tablet Take 325 mg by mouth daily with breakfast.     . furosemide (LASIX) 20 MG tablet Take 1 tablet (20 mg total) by mouth 2 (two) times daily. 60 tablet 0  . gabapentin (NEURONTIN) 300 MG capsule Take 300 mg by mouth 2 (two) times daily.     Marland Kitchen glipiZIDE (GLUCOTROL XL) 10 MG 24 hr tablet Take 10 mg by mouth 2 (two) times daily.     . hydrALAZINE (APRESOLINE) 25 MG tablet Take 50 mg by mouth 2 (two) times daily.  0  . insulin detemir (LEVEMIR) 100 UNIT/ML injection Inject 60 Units into the skin 2 (two) times daily.     Marland Kitchen losartan (COZAAR) 100 MG tablet Take 100 mg by mouth daily.    Marland Kitchen NOVOLOG FLEXPEN 100 UNIT/ML FlexPen Inject 2 Units into the skin daily before supper.  1  . simvastatin (ZOCOR) 40 MG tablet Take 40 mg by mouth daily at 6 PM.     No current facility-administered medications  on file prior to visit.    No Known Allergies  Social History:   reports that she has never smoked. She has never used smokeless tobacco. She reports that she does not drink alcohol or use drugs.  Family History  Problem Relation Age of Onset  . Adopted: Yes    Review of Systems: Noncontributory  PHYSICAL EXAM: Blood pressure (!) 152/96, height  (1.499 m), weight 296 lb (134.3 kg). CONSTITUTIONAL: Well-developed, well-nourished female in no acute distress.  HENT:  Normocephalic, atraumatic, External right and left ear normal. Oropharynx is clear and moist EYES: Conjunctivae and EOM are normal. Pupils are equal, round, and reactive to light. No scleral icterus.  NECK: Normal range of motion, supple, no masses SKIN: Skin is warm and dry. No rash noted. Not diaphoretic. No erythema. No pallor. NEUROLGIC: Alert and oriented to person, place, and time. Normal reflexes, muscle tone coordination. No cranial nerve deficit noted. PSYCHIATRIC: Normal mood and affect. Normal  behavior. Normal judgment and thought content. CARDIOVASCULAR: Normal heart rate noted, regular rhythm RESPIRATORY: Effort and breath sounds normal, no problems with respiration noted ABDOMEN: Soft, nontender, nondistended. PELVIC: Deferred MUSCULOSKELETAL: Normal range of motion. No edema and no tenderness. 2+ distal pulses.  Assessment: Patient Active Problem List   Diagnosis Date Noted  . Postmenopausal bleeding 04/17/2017  . Simple endometrial hyperplasia without atypia 04/17/2017  . Endometrial polyp 04/17/2017    Plan: Patient will undergo surgical management with hysteroscopy, dilation and curettage, and polypectomy.   The risks of surgery were discussed in detail with the patient including but not limited to: bleeding which may require transfusion or reoperation; infection which may require antibiotics; injury to surrounding organs which may involve bowel, bladder, ureters ; need for additional procedures including laparoscopy or laparotomy; thromboembolic phenomenon, surgical site problems and other postoperative/anesthesia complications. Likelihood of success in alleviating the patient's condition was discussed. Routine postoperative instructions will be reviewed with the patient and her family in detail after surgery.  The patient concurred with the proposed plan, giving informed written consent for the surgery.  Preoperative prophylactic antibiotics and SCDs ordered on call to the OR.  To OR when ready.  Thomasene Mohair, MD 07/31/2017 8:24 AM

## 2017-07-31 NOTE — Patient Instructions (Addendum)
Your procedure is scheduled on: August 07, 2017 [facility] ) Report to Same Day Surgery 2nd floor medical mall (Medical Mall Entrance-take elevator on left to 2nd floor.  Check in with surgery information desk.) To find out your arrival time please call [phone] between 1PM - 3PM on August 06, 2017 (MONDAY )  Remember: Instructions that are not followed completely may result in serious medical risk, up to and including death, or upon the discretion of your surgeon and anesthesiologist your surgery may need to be rescheduled.    _x___ 1. Do not eat food after midnight the night before your procedure. You may drink clear liquids up to 2 hours before you are scheduled to arrive at the hospital for your procedure.  Do not drink clear liquids within 2 hours of your scheduled arrival to the hospital.  Clear liquids include  --Water or Apple juice without pulp  --Clear carbohydrate beverage such as ClearFast or Gatorade  --Black Coffee or Clear Tea (No milk, no creamers, do not add anything to                  the coffee or Tea Type 1 and type 2 diabetics should only drink water.  No gum chewing or hard candies.     __x__ 2. No Alcohol for 24 hours before or after surgery.   __x__3. No Smoking for 24 prior to surgery.   ____  4. Bring all medications with you on the day of surgery if instructed.    __x__ 5. Notify your doctor if there is any change in your medical condition     (cold, fever, infections).     Do not wear jewelry, make-up, hairpins, clips or nail polish.  Do not wear lotions, powders, or perfumes.   Do not shave 48 hours prior to surgery. Men may shave face and neck.  Do not bring valuables to the hospital.    [facility] is not responsible for any belongings or valuables.               Contacts, dentures or bridgework may not be worn into surgery.  Leave your suitcase in the car. After surgery it may be brought to your room.  For patients admitted to the hospital,  discharge time is determined by your treatment team                 Patients discharged the day of surgery will not be allowed to drive home.  You will need someone to drive you home and stay with you the night of your procedure.    Please read over the following fact sheets that you were given:   [facility] Preparing for Surgery and or MRSA Information   TAKE THE FOLLOWING MEDICATIONS WITH A SIP OF WATER THE MORNING OF SURGERY :  1. AMLODIPINE  2. HYDRALAZINE  3. GABAPENTIN  4.  5.  6.  ____Fleets enema or Magnesium Citrate as directed.   _x___ Use CHG Soap or sage wipes as directed on instruction sheet   ____ Use inhalers on the day of surgery and bring to hospital day of surgery  ____ Stop Metformin and Janumet 2 days prior to surgery.    ____ Take 1/2 of usual insulin dose the night before surgery and none on the morning surgery.   (NO INSULIN THE MORNING OF SURGERY )  _x___ Follow recommendations from Cardiologist, Pulmonologist or PCP regarding          stopping Aspirin,  Coumadin, Plavix ,Eliquis, Effient, or Pradaxa, and Pletal. (STOP ASPIRIN NOW FOR SURGERY )  X____Stop Anti-inflammatories such as Advil, Aleve, Ibuprofen, Motrin, Naproxen, Naprosyn, Goodies powders or aspirin products. OK to take Tylenol                          _x___ Stop supplements until after surgery.  But may continue Vitamin D, Vitamin B and multivitamin     __x__ Bring C-Pap to the hospital.

## 2017-07-31 NOTE — Pre-Procedure Instructions (Signed)
Patient cleared at low risk for surgery and placed on chart.Julie Sawyer

## 2017-08-28 ENCOUNTER — Ambulatory Visit: Payer: BLUE CROSS/BLUE SHIELD | Admitting: Certified Registered Nurse Anesthetist

## 2017-08-28 ENCOUNTER — Ambulatory Visit
Admission: RE | Admit: 2017-08-28 | Discharge: 2017-08-28 | Disposition: A | Discharge status: Home / Self Care | Payer: BLUE CROSS/BLUE SHIELD | Source: Ambulatory Visit | Attending: Obstetrics and Gynecology | Admitting: Obstetrics and Gynecology

## 2017-08-28 ENCOUNTER — Encounter
Admission: RE | Disposition: A | Discharge status: Home / Self Care | Payer: Self-pay | Source: Ambulatory Visit | Attending: Obstetrics and Gynecology

## 2017-08-28 ENCOUNTER — Encounter: Payer: Self-pay | Admitting: *Deleted

## 2017-08-28 DIAGNOSIS — E119 Type 2 diabetes mellitus without complications: Secondary | ICD-10-CM | POA: Insufficient documentation

## 2017-08-28 DIAGNOSIS — Z7982 Long term (current) use of aspirin: Secondary | ICD-10-CM | POA: Insufficient documentation

## 2017-08-28 DIAGNOSIS — N840 Polyp of corpus uteri: Secondary | ICD-10-CM | POA: Diagnosis not present

## 2017-08-28 DIAGNOSIS — M1990 Unspecified osteoarthritis, unspecified site: Secondary | ICD-10-CM | POA: Diagnosis not present

## 2017-08-28 DIAGNOSIS — N950 Postmenopausal bleeding: Secondary | ICD-10-CM

## 2017-08-28 DIAGNOSIS — I10 Essential (primary) hypertension: Secondary | ICD-10-CM | POA: Diagnosis not present

## 2017-08-28 DIAGNOSIS — G4730 Sleep apnea, unspecified: Secondary | ICD-10-CM | POA: Insufficient documentation

## 2017-08-28 DIAGNOSIS — Z794 Long term (current) use of insulin: Secondary | ICD-10-CM | POA: Diagnosis not present

## 2017-08-28 DIAGNOSIS — N8501 Benign endometrial hyperplasia: Secondary | ICD-10-CM | POA: Diagnosis present

## 2017-08-28 DIAGNOSIS — Z79899 Other long term (current) drug therapy: Secondary | ICD-10-CM | POA: Insufficient documentation

## 2017-08-28 HISTORY — PX: ENDOMETRIAL ABLATION: SHX621

## 2017-08-28 HISTORY — PX: DILATATION & CURETTAGE/HYSTEROSCOPY WITH MYOSURE: SHX6511

## 2017-08-28 LAB — GLUCOSE, CAPILLARY
GLUCOSE-CAPILLARY: 107 mg/dL — AB (ref 65–99)
Glucose-Capillary: 100 mg/dL — ABNORMAL HIGH (ref 65–99)

## 2017-08-28 SURGERY — DILATATION & CURETTAGE/HYSTEROSCOPY WITH MYOSURE
Anesthesia: General

## 2017-08-28 MED ORDER — FENTANYL CITRATE (PF) 100 MCG/2ML IJ SOLN: 25.0000 ug | INTRAMUSCULAR | Status: DC | PRN

## 2017-08-28 MED ORDER — SUCCINYLCHOLINE CHLORIDE 20 MG/ML IJ SOLN
INTRAMUSCULAR | Status: DC | PRN
  Administered 2017-08-28: 100 mg via INTRAVENOUS

## 2017-08-28 MED ORDER — KETOROLAC TROMETHAMINE 30 MG/ML IJ SOLN
INTRAMUSCULAR | Status: AC
  Filled 2017-08-28: qty 1

## 2017-08-28 MED ORDER — GLYCOPYRROLATE 0.2 MG/ML IJ SOLN
INTRAMUSCULAR | Status: AC
  Filled 2017-08-28: qty 1

## 2017-08-28 MED ORDER — SUCCINYLCHOLINE CHLORIDE 20 MG/ML IJ SOLN
INTRAMUSCULAR | Status: AC
  Filled 2017-08-28: qty 1

## 2017-08-28 MED ORDER — FENTANYL CITRATE (PF) 100 MCG/2ML IJ SOLN
INTRAMUSCULAR | Status: DC | PRN
  Administered 2017-08-28: 25 ug via INTRAVENOUS

## 2017-08-28 MED ORDER — IBUPROFEN 600 MG PO TABS: 600.0000 mg | ORAL_TABLET | Freq: Four times a day (QID) | ORAL | 0 refills | Status: DC | PRN

## 2017-08-28 MED ORDER — FENTANYL CITRATE (PF) 100 MCG/2ML IJ SOLN
INTRAMUSCULAR | Status: AC
  Filled 2017-08-28: qty 2

## 2017-08-28 MED ORDER — LIDOCAINE HCL (PF) 2 % IJ SOLN
INTRAMUSCULAR | Status: AC
  Filled 2017-08-28: qty 10

## 2017-08-28 MED ORDER — FAMOTIDINE 20 MG PO TABS
ORAL_TABLET | ORAL | Status: AC
  Administered 2017-08-28: 20 mg via ORAL
  Filled 2017-08-28: qty 1

## 2017-08-28 MED ORDER — LIDOCAINE HCL (CARDIAC) 20 MG/ML IV SOLN
INTRAVENOUS | Status: DC | PRN
  Administered 2017-08-28: 100 mg via INTRAVENOUS

## 2017-08-28 MED ORDER — LACTATED RINGERS IV SOLN: INTRAVENOUS | Status: DC

## 2017-08-28 MED ORDER — DEXAMETHASONE SODIUM PHOSPHATE 10 MG/ML IJ SOLN
INTRAMUSCULAR | Status: DC | PRN
  Administered 2017-08-28: 10 mg via INTRAVENOUS

## 2017-08-28 MED ORDER — MIDAZOLAM HCL 2 MG/2ML IJ SOLN
INTRAMUSCULAR | Status: AC
  Filled 2017-08-28: qty 2

## 2017-08-28 MED ORDER — ONDANSETRON HCL 4 MG/2ML IJ SOLN
INTRAMUSCULAR | Status: DC | PRN
  Administered 2017-08-28: 4 mg via INTRAVENOUS

## 2017-08-28 MED ORDER — PHENYLEPHRINE HCL 10 MG/ML IJ SOLN
INTRAMUSCULAR | Status: DC | PRN
  Administered 2017-08-28: 200 ug via INTRAVENOUS
  Administered 2017-08-28 (×2): 100 ug via INTRAVENOUS
  Administered 2017-08-28: 200 ug via INTRAVENOUS
  Administered 2017-08-28: 100 ug via INTRAVENOUS
  Administered 2017-08-28 (×3): 200 ug via INTRAVENOUS
  Administered 2017-08-28: 100 ug via INTRAVENOUS

## 2017-08-28 MED ORDER — DEXAMETHASONE SODIUM PHOSPHATE 10 MG/ML IJ SOLN
INTRAMUSCULAR | Status: AC
  Filled 2017-08-28: qty 1

## 2017-08-28 MED ORDER — KETOROLAC TROMETHAMINE 30 MG/ML IJ SOLN
INTRAMUSCULAR | Status: DC | PRN
  Administered 2017-08-28: 15 mg via INTRAVENOUS

## 2017-08-28 MED ORDER — PROPOFOL 10 MG/ML IV BOLUS
INTRAVENOUS | Status: DC | PRN
  Administered 2017-08-28: 50 mg via INTRAVENOUS
  Administered 2017-08-28: 150 mg via INTRAVENOUS

## 2017-08-28 MED ORDER — PROPOFOL 10 MG/ML IV BOLUS
INTRAVENOUS | Status: AC
  Filled 2017-08-28: qty 20

## 2017-08-28 MED ORDER — MIDAZOLAM HCL 2 MG/2ML IJ SOLN
INTRAMUSCULAR | Status: DC | PRN
Start: 2017-08-28 — End: 2017-08-28
  Administered 2017-08-28: 1 mg via INTRAVENOUS

## 2017-08-28 MED ORDER — EPHEDRINE SULFATE 50 MG/ML IJ SOLN
INTRAMUSCULAR | Status: AC
  Filled 2017-08-28: qty 1

## 2017-08-28 MED ORDER — GLYCOPYRROLATE 0.2 MG/ML IJ SOLN
INTRAMUSCULAR | Status: DC | PRN
  Administered 2017-08-28: 0.2 mg via INTRAVENOUS

## 2017-08-28 MED ORDER — SODIUM CHLORIDE 0.9 % IV SOLN
INTRAVENOUS | Status: DC
  Administered 2017-08-28: 12:00:00 via INTRAVENOUS

## 2017-08-28 MED ORDER — FAMOTIDINE 20 MG PO TABS
20.0000 mg | ORAL_TABLET | Freq: Once | ORAL | Status: AC
  Administered 2017-08-28: 20 mg via ORAL

## 2017-08-28 MED ORDER — ONDANSETRON HCL 4 MG/2ML IJ SOLN
INTRAMUSCULAR | Status: AC
  Filled 2017-08-28: qty 2

## 2017-08-28 MED ORDER — ONDANSETRON HCL 4 MG/2ML IJ SOLN: 4.0000 mg | Freq: Once | INTRAMUSCULAR | Status: DC | PRN

## 2017-08-28 SURGICAL SUPPLY — 23 items
ABLATOR ENDOMETRIAL MYOSURE (ABLATOR) IMPLANT
BAG URO DRAIN 2000ML W/SPOUT (MISCELLANEOUS) IMPLANT
CANISTER SUC SOCK COL 7IN (MISCELLANEOUS) ×3 IMPLANT
CATH FOLEY 2WAY  5CC 16FR (CATHETERS)
CATH ROBINSON RED A/P 16FR (CATHETERS) ×3 IMPLANT
CATH URTH 16FR FL 2W BLN LF (CATHETERS) IMPLANT
DEVICE MYOSURE LITE (MISCELLANEOUS) ×3 IMPLANT
ELECT REM PT RETURN 9FT ADLT (ELECTROSURGICAL) ×3
ELECTRODE REM PT RTRN 9FT ADLT (ELECTROSURGICAL) ×1 IMPLANT
GLOVE BIO SURGEON STRL SZ7 (GLOVE) ×12 IMPLANT
GLOVE BIOGEL PI IND STRL 7.5 (GLOVE) ×4 IMPLANT
GLOVE BIOGEL PI INDICATOR 7.5 (GLOVE) ×8
GOWN STRL REUS W/ TWL LRG LVL3 (GOWN DISPOSABLE) ×1 IMPLANT
GOWN STRL REUS W/TWL LRG LVL3 (GOWN DISPOSABLE) ×2
IV LACTATED RINGER IRRG 3000ML (IV SOLUTION) ×2
IV LR IRRIG 3000ML ARTHROMATIC (IV SOLUTION) ×1 IMPLANT
KIT RM TURNOVER CYSTO AR (KITS) ×3 IMPLANT
PACK DNC HYST (MISCELLANEOUS) ×3 IMPLANT
PAD OB MATERNITY 4.3X12.25 (PERSONAL CARE ITEMS) ×3 IMPLANT
PAD PREP 24X41 OB/GYN DISP (PERSONAL CARE ITEMS) ×3 IMPLANT
TUBING CONNECTING 10 (TUBING) ×2 IMPLANT
TUBING CONNECTING 10' (TUBING) ×1
TUBING HYSTEROSCOPY DOLPHIN (MISCELLANEOUS) ×3 IMPLANT

## 2017-08-28 NOTE — Op Note (Signed)
  Operative Note    Pre-Op Diagnosis:  1) postmenopausal bleeding 2) endometrial polyp 3) simple endometrial hyperplasia without atypia  Post-Op Diagnosis:  1) postmenopausal bleeding 2) endometrial polyp 3) simple endometrial hyperplasia without atypia  Procedures:  1) Hysteroscopy 2) dilation and curettage 3) endometrial polypectomy  Primary Surgeon: Thomasene Mohair, MD   EBL: 20 mL   IVF: 500 mL   Urine output: 75 mL  Specimens: endometrial polyp and curettings  Drains: none  Complications: None   Disposition: PACU   Condition: Stable   Findings:  1) endometrial polypoid lesion 2) narrow uterine cavity with abundant tissue obscuring visualization  Procedure Summary:  After informed consent was obtained, the patient was taken to the operating room where anesthesia was obtained without difficulty. The patient was positioned in the dorsal lithotomy position in [place] stirrups.  The patient's bladder was catheterized with an in-and-out foley catheter.  The patient was examined under anesthesia, with the above noted findings.  The bi-valved speculum was placed inside the patient's vagina, and the the anterior lip of the cervix was seen and grasped with the tenaculum.  The cervix was progressively dilated to a 7 mm Hegar dilator.  The hysteroscope was introduced, with the above noted findings. The MyoSure Light device was attached and in accordance to the manufacturer's recommendation, the polyp was removed and as much tissue that could be sampled and removed safely was removed.  With concern for perforating the uterus, the procedure was discontinued prior to removal of all endometrioid tissue.  Again, given concern for risk of uterine perforation with continuing with the procedure, the curette was not utlized in favor of using the MyoSure device to sample tissue so that direct camera visualization could be utilized.   Excellent hemostasis was noted, and all instruments were  removed, with excellent hemostasis noted throughout.  She was then taken out of dorsal lithotomy.  The patient tolerated the procedure well.  Sponge, lap, needle, and instrument counts were correct x 2.  VTE prophylaxis: SCDs. Antibiotic prophylaxis: none indicated and none given. She was awakened in the operating room and was taken to the PACU in stable condition.   Thomasene Mohair, MD 08/28/2017 2:18 PM

## 2017-08-28 NOTE — Anesthesia Procedure Notes (Signed)
Procedure Name: Intubation Date/Time: 08/28/2017 1:19 PM Performed by: Ginger Carne Pre-anesthesia Checklist: Patient identified, Emergency Drugs available, Suction available, Patient being monitored and Timeout performed Patient Re-evaluated:Patient Re-evaluated prior to induction Oxygen Delivery Method: Circle system utilized Preoxygenation: Pre-oxygenation with 100% oxygen Induction Type: IV induction Ventilation: Mask ventilation with difficulty, Oral airway inserted - appropriate to patient size and Two handed mask ventilation required Laryngoscope Size: McGraph and 3 Grade View: Grade II Tube type: Oral Tube size: 7.0 mm Number of attempts: 1 Airway Equipment and Method: Stylet and Video-laryngoscopy Placement Confirmation: ETT inserted through vocal cords under direct vision,  positive ETCO2 and breath sounds checked- equal and bilateral Secured at: 20 cm Tube secured with: Tape Dental Injury: Teeth and Oropharynx as per pre-operative assessment  Difficulty Due To: Difficulty was anticipated, Difficult Airway- due to large tongue, Difficult Airway- due to anterior larynx and Difficult Airway- due to limited oral opening Future Recommendations: Recommend- induction with short-acting agent, and alternative techniques readily available

## 2017-08-28 NOTE — Anesthesia Post-op Follow-up Note (Signed)
Anesthesia QCDR form completed.        

## 2017-08-28 NOTE — Discharge Instructions (Signed)
Hysteroscopy Hysteroscopy is a procedure used for looking inside the womb (uterus). It may be done for various reasons, including:  To evaluate abnormal bleeding, fibroid (benign, noncancerous) tumors, polyps, scar tissue (adhesions), and possibly cancer of the uterus.  To look for lumps (tumors) and other uterine growths.  To look for causes of why a woman cannot get pregnant (infertility), causes of recurrent loss of pregnancy (miscarriages), or a lost intrauterine device (IUD).  To perform a sterilization by blocking the fallopian tubes from inside the uterus.  In this procedure, a thin, flexible tube with a tiny light and camera on the end of it (hysteroscope) is used to look inside the uterus. A hysteroscopy should be done right after a menstrual period to be sure you are not pregnant. LET [facility] CARE PROVIDER KNOW ABOUT:  Any allergies you have.  All medicines you are taking, including vitamins, herbs, eye drops, creams, and over-the-counter medicines.  Previous problems you or members of your family have had with the use of anesthetics.  Any blood disorders you have.  Previous surgeries you have had.  Medical conditions you have. RISKS AND COMPLICATIONS Generally, this is a safe procedure. However, as with any procedure, complications can occur. Possible complications include:  Putting a hole in the uterus.  Excessive bleeding.  Infection.  Damage to the cervix.  Injury to other organs.  Allergic reaction to medicines.  Too much fluid used in the uterus for the procedure.  BEFORE THE PROCEDURE  Ask your health care provider about changing or stopping any regular medicines.  Do not take aspirin or blood thinners for 1 week before the procedure, or as directed by your health care provider. These can cause bleeding.  If you smoke, do not smoke for 2 weeks before the procedure.  In some cases, a medicine is placed in the cervix the day before the procedure.  This medicine makes the cervix have a larger opening (dilate). This makes it easier for the instrument to be inserted into the uterus during the procedure.  Do not eat or drink anything for at least 8 hours before the surgery.  Arrange for someone to take you home after the procedure. PROCEDURE  You may be given a medicine to relax you (sedative). You may also be given one of the following: ? A medicine that numbs the area around the cervix (local anesthetic). ? A medicine that makes you sleep through the procedure (general anesthetic).  The hysteroscope is inserted through the vagina into the uterus. The camera on the hysteroscope sends a picture to a TV screen. This gives the surgeon a good view inside the uterus.  During the procedure, air or a liquid is put into the uterus, which allows the surgeon to see better.  Sometimes, tissue is gently scraped from inside the uterus. These tissue samples are sent to a lab for testing. What to expect after the procedure  If you had a general anesthetic, you may be groggy for a couple hours after the procedure.  If you had a local anesthetic, you will be able to go home as soon as you are stable and feel ready.  You may have some cramping. This normally lasts for a couple days.  You may have bleeding, which varies from light spotting for a few days to menstrual-like bleeding for 3-7 days. This is normal.  If your test results are not back during the visit, make an appointment with your health care provider to find out the  results. This information is not intended to replace advice given to you by your health care provider. Make sure you discuss any questions you have with your health care provider. Document Released: 02/05/2001 Document Revised: 04/06/2016 Document Reviewed: 05/29/2013 Elsevier Interactive Patient Education  2017 [employer]    AMBULATORY SURGERY  DISCHARGE INSTRUCTIONS   1) The drugs that you were given will stay in  your system until tomorrow so for the next 24 hours you should not:  A) Drive an automobile B) Make any legal decisions C) Drink any alcoholic beverage   2) You may resume regular meals tomorrow.  Today it is better to start with liquids and gradually work up to solid foods.  You may eat anything you prefer, but it is better to start with liquids, then soup and crackers, and gradually work up to solid foods.   3) Please notify your doctor immediately if you have any unusual bleeding, trouble breathing, redness and pain at the surgery site, drainage, fever, or pain not relieved by medication.    4) Additional Instructions:   Please contact your physician with any problems or Same Day Surgery at [phone], Monday through Friday 6 am to 4 pm, or [place] [facility] number at [phone].

## 2017-08-28 NOTE — Transfer of Care (Signed)
Immediate Anesthesia Transfer of Care Note  Patient: Julie Sawyer  Procedure(s) Performed: DILATATION & CURETTAGE/HYSTEROSCOPY WITH MYOSURE (N/A ) ENDOMETRIAL POLYPECTOMY (N/A )  Patient Location: PACU  Anesthesia Type:General  Level of Consciousness: awake and alert   Airway & Oxygen Therapy: Patient Spontanous Breathing and Patient connected to face mask oxygen  Post-op Assessment: Report given to RN and Post -op Vital signs reviewed and stable  Post vital signs: Reviewed and stable  Last Vitals:  Vitals:   08/28/17 1151  BP: (!) 156/65  Pulse: 62  Resp: 20  Temp: 36.8 C  SpO2: 99%    Last Pain:  Vitals:   08/28/17 1151  TempSrc: Oral         Complications: No apparent anesthesia complications

## 2017-08-28 NOTE — Interval H&P Note (Signed)
History and Physical Interval Note:  08/28/2017 1:05 PM  Julie Sawyer  has presented today for surgery, with the diagnosis of POST MENOPAUSAL BLEEDING,SIMPLE HYPERPLASIA WITHOUT ATYPIA,ENDOMETRIAL POLYP  The various methods of treatment have been discussed with the patient and family. After consideration of risks, benefits and other options for treatment, the patient has consented to  Procedure(s): DILATATION & CURETTAGE/HYSTEROSCOPY WITH MYOSURE (N/A) ENDOMETRIAL POLYPECTOMY (N/A) as a surgical intervention .  The patient's history has been reviewed, patient examined, stable for surgery.  Medication changes noted and will not affect surgery.  I have reviewed the patient's chart and labs.  Questions were answered to the patient's satisfaction.    Thomasene Mohair, MD 08/28/2017 1:06 PM

## 2017-08-28 NOTE — H&P (View-Only) (Signed)
Preoperative History and Physical  Julie Sawyer is a 61 y.o. [id number] here for surgical management of postmenopausal bleeding.   The patient has been cleared by her PCP for surgery.  History of Present Illness: 61 y.o. female postmenopausal female who was seen by me last year and was diagnosed on endometrial biopsy with simple endometrial hyperplasia without atypia, possibly arising in the setting of a polyp. It was recommended to her to have a hysteroscopy, dilation and curettage with polypectomy for definitive diagnosis. She continued to cancel her surgery and has not been back for about a year.  She returns saying she has had no bleeding since she last saw me until about two weeks ago where she had some spotting on the toilet tissue. She has had no bleeding since that time. Denies bloating, new constipation, early satiety.   Repeat endometrial biopsy this year in June shows similar pathology.    Proposed surgery: hysteroscopy, dilation and curettage, polypectomy  Past Medical History:  Diagnosis Date  . Anemia   . Arthritis   . Bursitis   . Diabetes mellitus without complication (HCC)   . Elevated lipids   . Hypertension   . Obesity   . Shortness of breath dyspnea   . Sleep apnea    use CPAP  . Vaginal bleeding    Past Surgical History:  Procedure Laterality Date  . BREAST BIOPSY Left    neg  . BREAST SURGERY Left   . CESAREAN SECTION    . TONSILLECTOMY    . TUBAL LIGATION     OB History  Gravida Para Term Preterm AB Living  SAB TAB Ectopic Multiple Live Births    1          # Outcome Date GA Lbr Len/2nd Weight Sex Delivery Anes PTL Lv  3 Preterm 06/11/88 [redacted]w[redacted]d  2 lb 1.6 oz (0.953 kg) F CS-Classical     2 Term 08/28/77   5 lb 4.8 oz (2.404 kg) M Vag-Spont     1 TAB             Patient denies any other pertinent gynecologic issues.   Current Outpatient Prescriptions on File Prior to Visit  Medication Sig Dispense Refill  . amLODipine (NORVASC) 10 MG  tablet Take 10 mg by mouth daily.  0  . aspirin 81 MG tablet Take 81 mg by mouth daily.    . ferrous sulfate 325 (65 FE) MG EC tablet Take 325 mg by mouth daily with breakfast.     . furosemide (LASIX) 20 MG tablet Take 1 tablet (20 mg total) by mouth 2 (two) times daily. 60 tablet 0  . gabapentin (NEURONTIN) 300 MG capsule Take 300 mg by mouth 2 (two) times daily.     Marland Kitchen glipiZIDE (GLUCOTROL XL) 10 MG 24 hr tablet Take 10 mg by mouth 2 (two) times daily.     . hydrALAZINE (APRESOLINE) 25 MG tablet Take 50 mg by mouth 2 (two) times daily.  0  . insulin detemir (LEVEMIR) 100 UNIT/ML injection Inject 60 Units into the skin 2 (two) times daily.     Marland Kitchen losartan (COZAAR) 100 MG tablet Take 100 mg by mouth daily.    Marland Kitchen NOVOLOG FLEXPEN 100 UNIT/ML FlexPen Inject 2 Units into the skin daily before supper.  1  . simvastatin (ZOCOR) 40 MG tablet Take 40 mg by mouth daily at 6 PM.     No current facility-administered medications  on file prior to visit.    No Known Allergies  Social History:   reports that she has never smoked. She has never used smokeless tobacco. She reports that she does not drink alcohol or use drugs.  Family History  Problem Relation Age of Onset  . Adopted: Yes    Review of Systems: Noncontributory  PHYSICAL EXAM: Blood pressure (!) 152/96, height  (1.499 m), weight 296 lb (134.3 kg). CONSTITUTIONAL: Well-developed, well-nourished female in no acute distress.  HENT:  Normocephalic, atraumatic, External right and left ear normal. Oropharynx is clear and moist EYES: Conjunctivae and EOM are normal. Pupils are equal, round, and reactive to light. No scleral icterus.  NECK: Normal range of motion, supple, no masses SKIN: Skin is warm and dry. No rash noted. Not diaphoretic. No erythema. No pallor. NEUROLGIC: Alert and oriented to person, place, and time. Normal reflexes, muscle tone coordination. No cranial nerve deficit noted. PSYCHIATRIC: Normal mood and affect. Normal  behavior. Normal judgment and thought content. CARDIOVASCULAR: Normal heart rate noted, regular rhythm RESPIRATORY: Effort and breath sounds normal, no problems with respiration noted ABDOMEN: Soft, nontender, nondistended. PELVIC: Deferred MUSCULOSKELETAL: Normal range of motion. No edema and no tenderness. 2+ distal pulses.  Assessment: Patient Active Problem List   Diagnosis Date Noted  . Postmenopausal bleeding 04/17/2017  . Simple endometrial hyperplasia without atypia 04/17/2017  . Endometrial polyp 04/17/2017    Plan: Patient will undergo surgical management with hysteroscopy, dilation and curettage, and polypectomy.   The risks of surgery were discussed in detail with the patient including but not limited to: bleeding which may require transfusion or reoperation; infection which may require antibiotics; injury to surrounding organs which may involve bowel, bladder, ureters ; need for additional procedures including laparoscopy or laparotomy; thromboembolic phenomenon, surgical site problems and other postoperative/anesthesia complications. Likelihood of success in alleviating the patient's condition was discussed. Routine postoperative instructions will be reviewed with the patient and her family in detail after surgery.  The patient concurred with the proposed plan, giving informed written consent for the surgery.  Preoperative prophylactic antibiotics and SCDs ordered on call to the OR.  To OR when ready.  Thomasene Mohair, MD 07/31/2017 8:24 AM

## 2017-08-28 NOTE — Anesthesia Preprocedure Evaluation (Signed)
Anesthesia Evaluation  Patient identified by MRN, date of birth, ID band Patient awake    Reviewed: Allergy & Precautions, NPO status , Patient's Chart, lab work & pertinent test results, reviewed documented beta blocker date and time   Airway Mallampati: III  TM Distance: >3 FB     Dental  (+) Chipped   Pulmonary shortness of breath, sleep apnea ,           Cardiovascular hypertension, Pt. on medications      Neuro/Psych    GI/Hepatic   Endo/Other  diabetes, Type obesity  Renal/GU      Musculoskeletal  (+) Arthritis ,   Abdominal   Peds  Hematology  (+) anemia ,   Anesthesia Other Findings   Reproductive/Obstetrics                             Anesthesia Physical Anesthesia Plan  ASA: III  Anesthesia Plan: General   Post-op Pain Management:    Induction: Intravenous  PONV Risk Score and Plan:   Airway Management Planned: Oral ETT  Additional Equipment:   Intra-op Plan:   Post-operative Plan:   Informed Consent: I have reviewed the patients History and Physical, chart, labs and discussed the procedure including the risks, benefits and alternatives for the proposed anesthesia with the patient or authorized representative who has indicated his/her understanding and acceptance.     Plan Discussed with: CRNA  Anesthesia Plan Comments:         Anesthesia Quick Evaluation

## 2017-08-29 ENCOUNTER — Ambulatory Visit: Payer: BLUE CROSS/BLUE SHIELD | Admitting: Obstetrics and Gynecology

## 2017-08-29 ENCOUNTER — Encounter: Payer: Self-pay | Admitting: Obstetrics and Gynecology

## 2017-08-29 NOTE — Anesthesia Postprocedure Evaluation (Signed)
Anesthesia Post Note  Patient: Bonnita NasutiWanda J Pais  Procedure(s) Performed: DILATATION & CURETTAGE/HYSTEROSCOPY WITH MYOSURE (N/A ) ENDOMETRIAL POLYPECTOMY (N/A )  Patient location during evaluation: PACU Anesthesia Type: General Level of consciousness: awake and alert Pain management: pain level controlled Vital Signs Assessment: post-procedure vital signs reviewed and stable Respiratory status: spontaneous breathing, nonlabored ventilation, respiratory function stable and patient connected to nasal cannula oxygen Cardiovascular status: blood pressure returned to baseline and stable Postop Assessment: no apparent nausea or vomiting Anesthetic complications: no     Last Vitals:  Vitals:   08/28/17 1533 08/28/17 1612  BP: (!) 134/41 (!) (P) 147/52  Pulse:  (P) 60  Resp: 16 (P) 16  Temp: (!) 36.1 C   SpO2: 99% (P) 98%    Last Pain:  Vitals:   08/29/17 0837  TempSrc:   PainSc: 0-No pain                 , S

## 2017-08-30 LAB — SURGICAL PATHOLOGY

## 2017-08-31 ENCOUNTER — Telehealth: Payer: Self-pay | Admitting: Obstetrics and Gynecology

## 2017-08-31 NOTE — Telephone Encounter (Signed)
Patient called, started spotting yesterday. Please call back.

## 2017-08-31 NOTE — Telephone Encounter (Signed)
Please call.

## 2017-08-31 NOTE — Telephone Encounter (Signed)
Discussed that some spotting (she states just started having mild spotting) is normal.   Discussed pathology findings.   Keep follow up appt for planning on treatment of hyperplasia without atypia.

## 2017-09-11 ENCOUNTER — Ambulatory Visit (INDEPENDENT_AMBULATORY_CARE_PROVIDER_SITE_OTHER): Payer: BLUE CROSS/BLUE SHIELD | Admitting: Obstetrics and Gynecology

## 2017-09-11 VITALS — BP 136/84 | Ht 60.0 in | Wt 298.0 lb

## 2017-09-11 DIAGNOSIS — N8501 Benign endometrial hyperplasia: Secondary | ICD-10-CM

## 2017-09-11 DIAGNOSIS — N950 Postmenopausal bleeding: Secondary | ICD-10-CM

## 2017-09-11 DIAGNOSIS — Z09 Encounter for follow-up examination after completed treatment for conditions other than malignant neoplasm: Secondary | ICD-10-CM

## 2017-09-11 MED ORDER — MEDROXYPROGESTERONE ACETATE 10 MG PO TABS: 10.0000 mg | ORAL_TABLET | Freq: Every day | ORAL | 6 refills | Status: DC

## 2017-09-11 NOTE — Progress Notes (Signed)
   Postoperative Follow-up Patient presents post op from hysteroscopy, D&C, polypectomy, myomectomy 2weeks ago for postmenopausal bleeding with evidence of endometrial polyp and simple hyperplasia without atypia.  Subjective: Patient reports marked improvement in her preop symptoms. Eating a regular diet without difficulty. The patient is not having any pain.  Activity: normal activities of daily living.  Objective: Vitals:   09/11/17 0805  BP: 136/84   Vital Signs: BP 136/84   Ht 5' (1.524 m)   Wt 298 lb (135.2 kg)   BMI 58.20 kg/m  Constitutional: Well nourished, well developed female in no acute distress.  HEENT: normal Skin: Warm and dry.  Extremity: no edema  Abdomen: Soft, non-tender, normal bowel sounds; no bruits, organomegaly or masses.  Assessment: 61 y.o. s/p hysteroscopy, D&C, polypectomy, myomectomy for PMB, simple hyperplasia with atypia progressing well  Plan: Patient has done well after surgery with no apparent complications.  I have discussed the post-operative course to date, and the expected progress moving forward.  The patient understands what complications to be concerned about.  I will see the patient in routine follow up, or sooner if needed.    Start provera 10 mg po Q day x 12 days per month. RTC in 6 months for follow up and biopsy.  Activity plan: No restriction.  Thomasene MohairStephen , MD 09/11/2017, 8:11 AM

## 2017-11-17 ENCOUNTER — Encounter: Payer: Self-pay | Admitting: Emergency Medicine

## 2017-11-17 ENCOUNTER — Emergency Department: Payer: BLUE CROSS/BLUE SHIELD

## 2017-11-17 ENCOUNTER — Emergency Department
Admission: EM | Admit: 2017-11-17 | Discharge: 2017-11-17 | Disposition: A | Discharge status: Home / Self Care | Payer: BLUE CROSS/BLUE SHIELD | Attending: Emergency Medicine | Admitting: Emergency Medicine

## 2017-11-17 ENCOUNTER — Other Ambulatory Visit: Payer: Self-pay

## 2017-11-17 DIAGNOSIS — Z794 Long term (current) use of insulin: Secondary | ICD-10-CM | POA: Diagnosis not present

## 2017-11-17 DIAGNOSIS — Z79899 Other long term (current) drug therapy: Secondary | ICD-10-CM | POA: Insufficient documentation

## 2017-11-17 DIAGNOSIS — E119 Type 2 diabetes mellitus without complications: Secondary | ICD-10-CM | POA: Insufficient documentation

## 2017-11-17 DIAGNOSIS — Z7982 Long term (current) use of aspirin: Secondary | ICD-10-CM | POA: Diagnosis not present

## 2017-11-17 DIAGNOSIS — M1711 Unilateral primary osteoarthritis, right knee: Secondary | ICD-10-CM | POA: Diagnosis not present

## 2017-11-17 DIAGNOSIS — M25561 Pain in right knee: Secondary | ICD-10-CM | POA: Diagnosis present

## 2017-11-17 DIAGNOSIS — I10 Essential (primary) hypertension: Secondary | ICD-10-CM | POA: Diagnosis not present

## 2017-11-17 MED ORDER — DICLOFENAC SODIUM 50 MG PO TBEC: 50.0000 mg | DELAYED_RELEASE_TABLET | Freq: Two times a day (BID) | ORAL | 0 refills | Status: DC

## 2017-11-17 MED ORDER — HYDROCODONE-ACETAMINOPHEN 5-325 MG PO TABS: 1.0000 | ORAL_TABLET | Freq: Four times a day (QID) | ORAL | 0 refills | Status: DC | PRN

## 2017-11-17 MED ORDER — HYDROCODONE-ACETAMINOPHEN 5-325 MG PO TABS
1.0000 | ORAL_TABLET | Freq: Once | ORAL | Status: AC
  Administered 2017-11-17: 1 via ORAL
  Filled 2017-11-17: qty 1

## 2017-11-17 MED ORDER — HYDROCODONE-ACETAMINOPHEN 5-325 MG PO TABS: 1.0000 | ORAL_TABLET | Freq: Once | ORAL | Status: DC

## 2017-11-17 NOTE — ED Triage Notes (Signed)
Pt reports that she was in bed this am and moved a certain way and her right knee began to hurt. No deformity or swelling seen. States that it hurts to bear weight. Only hurts when she moves, if she is sitting still it does not hurt.

## 2017-11-17 NOTE — ED Provider Notes (Signed)
[facility] Emergency Department Provider Note   ____________________________________________   First MD Initiated Contact with Patient 11/17/17 [phone]     (approximate)  I have reviewed the triage vital signs and the nursing notes.   HISTORY  Chief Complaint Leg Pain   HPI Julie Sawyer is a 62 y.o. female is here complaining of right knee pain. Patient states that she was in bed and turned over, she then began having right knee pain. She denies any previous injury to her knee. Patient states that there is increased pain with movement. She has not taken any over-the-counter medication for her knee pain prior to arrival.she rates her pain as 10 over 10.   Past Medical History:  Diagnosis Date  . Anemia   . Arthritis   . Bursitis   . Diabetes mellitus without complication (HCC)   . Elevated lipids   . Hypertension   . Obesity   . Shortness of breath dyspnea   . Sleep apnea    use CPAP  . Vaginal bleeding     Patient Active Problem List   Diagnosis Date Noted  . Postmenopausal bleeding 04/17/2017  . Simple endometrial hyperplasia without atypia 04/17/2017  . Endometrial polyp 04/17/2017    Past Surgical History:  Procedure Laterality Date  . BREAST BIOPSY Left    neg  . BREAST SURGERY Left   . CESAREAN SECTION  1989  . DILATATION & CURETTAGE/HYSTEROSCOPY WITH MYOSURE N/A 08/28/2017   Procedure: DILATATION & CURETTAGE/HYSTEROSCOPY WITH MYOSURE;  Surgeon: Conard Novak, MD;  Location: [facility] ORS;  Service: Gynecology;  Laterality: N/A;  . DILATION AND CURETTAGE OF UTERUS    . ENDOMETRIAL ABLATION N/A 08/28/2017   Procedure: ENDOMETRIAL POLYPECTOMY;  Surgeon: Conard Novak, MD;  Location: [facility] ORS;  Service: Gynecology;  Laterality: N/A;  . TONSILLECTOMY    . TUBAL LIGATION      Prior to Admission medications   Medication Sig Start Date End Date Taking? Authorizing Provider  aspirin 81 MG tablet Take 81 mg by mouth daily.     [provider]  diclofenac (VOLTAREN) 50 MG EC tablet Take 1 tablet (50 mg total) by mouth 2 (two) times daily. 11/17/17   Tommi Rumps, PA-C  ferrous sulfate 325 (65 FE) MG EC tablet Take 325 mg by mouth daily with breakfast.     [provider]  furosemide (LASIX) 20 MG tablet Take 1 tablet (20 mg total) by mouth 2 (two) times daily. Patient not taking: Reported on 09/11/2017 01/04/17 01/04/18  Jennye Moccasin, MD  gabapentin (NEURONTIN) 300 MG capsule Take 300 mg by mouth 2 (two) times daily.     [provider]  glipiZIDE (GLUCOTROL XL) 10 MG 24 hr tablet Take 10 mg by mouth 2 (two) times daily.     [provider]  hydrALAZINE (APRESOLINE) 25 MG tablet Take 50 mg by mouth 2 (two) times daily. 07/13/17   [provider]  HYDROcodone-acetaminophen (NORCO/VICODIN) 5-325 MG tablet Take 1 tablet by mouth every 6 (six) hours as needed for moderate pain. 11/17/17   Tommi Rumps, PA-C  insulin detemir (LEVEMIR) 100 UNIT/ML injection Inject 60 Units into the skin 2 (two) times daily.     [provider]  losartan (COZAAR) 100 MG tablet Take 100 mg by mouth daily.    [provider]  medroxyPROGESTERone (PROVERA) 10 MG tablet Take 1 tablet (10 mg total) by mouth daily. 09/11/17 09/23/17  Conard Novak, MD  metoprolol tartrate (LOPRESSOR) 50 MG tablet Take 50 mg by mouth once.    [provider]  metoprolol-hydrochlorothiazide (LOPRESSOR HCT) 100-25 MG tablet Take 1 tablet by mouth daily.    [provider]  NOVOLOG FLEXPEN 100 UNIT/ML FlexPen Inject 2 Units into the skin daily before supper. 10/17/16   [provider]  simvastatin (ZOCOR) 40 MG tablet Take 40 mg by mouth daily at 6 PM.    [provider]    Allergies Patient has no known allergies.  Family History  Adopted: Yes    Social History Social History   Tobacco Use  . Smoking status: Never Smoker  . Smokeless tobacco: Never  Used  Substance Use Topics  . Alcohol use: No  . Drug use: No    Review of Systems Constitutional: No fever/chills Cardiovascular: Denies chest pain. Respiratory: Denies shortness of breath. Gastrointestinal: No abdominal pain.  No nausea, no vomiting.   Musculoskeletal: Negative for back pain. Positive for right knee pain. Skin: Negative for rash. Neurological: Negative for  focal weakness or numbness. ____________________________________________   PHYSICAL EXAM:  VITAL SIGNS: ED Triage Vitals  Enc Vitals Group     BP 11/17/17 0818 (!) 148/55     Pulse Rate 11/17/17 0818 63     Resp 11/17/17 0818 18     Temp 11/17/17 0818 98.1 F (36.7 C)     Temp Source 11/17/17 0818 Oral     SpO2 11/17/17 0818 96 %     Weight 11/17/17 0818 290 lb (131.5 kg)     Height 11/17/17 0818 5' (1.524 m)     Head Circumference --      Peak Flow --      Pain Score 11/17/17 0817 10     Pain Loc --      Pain Edu? --      Excl. in GC? --    Constitutional: Alert and oriented. Well appearing and in no acute distress. Eyes: Conjunctivae are normal.  Head: Atraumatic. Neck: No stridor.   Cardiovascular: Normal rate, regular rhythm. Grossly normal heart sounds.  Good peripheral circulation. Respiratory: Normal respiratory effort.  No retractions. Lungs CTAB. Musculoskeletal: on examination of the right knee there is degenerative appearance that is obvious but no deformity. No evidence of effusion. Range of motion is difficult secondary to patient's pain. Crepitus is noted. Skin is intact without ecchymosis or abrasions. No erythema is present.Motor sensory function intact. Pulses present. Neurologic:  Normal speech and language. No gross focal neurologic deficits are appreciated.  Skin:  Skin is warm, dry and intact.  Psychiatric: Mood and affect are normal. Speech and behavior are normal.  ____________________________________________   LABS (all labs ordered are listed, but only abnormal results  are displayed)  Labs Reviewed - No data to display   RADIOLOGY  Dg Knee Complete 4 Views Right  Result Date: 11/17/2017 CLINICAL DATA:  Acute right knee pain today.  Initial encounter. EXAM: RIGHT KNEE - COMPLETE 4+ VIEW COMPARISON:  None. FINDINGS: There is no evidence of acute fracture or dislocation. Tricompartmental degenerative changes are identified, severe in the medial compartment and moderate in the lateral and patellofemoral compartments. There is no evidence of joint effusion. No suspicious bony lesions are present. IMPRESSION: 1. No evidence of acute abnormality 2. Tricompartmental degenerative changes, severe in the medial compartment. Electronically Signed   By: Harmon Pier M.D.   On: 11/17/2017 09:09    ____________________________________________   PROCEDURES  Procedure(s) performed: None  Procedures  Critical  Care performed: No  ____________________________________________   INITIAL IMPRESSION / ASSESSMENT AND PLAN / ED COURSE Patient is made aware that she has osteoarthritis of her right knee and the joint space is narrowed. Patient is to follow-up with her PCP and also she was given the name of the orthopedist on call to make an appointment with if needed. Patient was wrapped in a Jones wrap for support temporarily. Patient was given a walker. She was given Norco while in the department along with prescription for a limited amount. Patient was given a prescription for Voltaren twice a day with food. ____________________________________________   FINAL CLINICAL IMPRESSION(S) / ED DIAGNOSES  Final diagnoses:  Osteoarthritis of right knee, unspecified osteoarthritis type     ED Discharge Orders        Ordered    HYDROcodone-acetaminophen (NORCO/VICODIN) 5-325 MG tablet  Every 6 hours PRN     11/17/17 0948    diclofenac (VOLTAREN) 50 MG EC tablet  2 times daily     11/17/17 0948       Note:  This document was prepared using Dragon voice recognition  software and may include unintentional dictation errors.    Tommi RumpsSummers, Rhonda L, PA-C 11/17/17 1026    Governor RooksLord, Rebecca, MD 11/17/17 [phone]

## 2017-11-17 NOTE — Discharge Instructions (Signed)
Call and make an appointment with your  primary care doctor for this coming week. Also  [facility] or orthopedist of your choice. Begin taking Norco as needed for moderate pain. Take diclofenac 50 mg twice a day with food for inflammation.

## 2017-11-17 NOTE — ED Notes (Signed)
Ace wrap applied and pt instructed how to use walker by EDT Crystal

## 2017-12-15 ENCOUNTER — Emergency Department
Admission: EM | Admit: 2017-12-15 | Discharge: 2017-12-15 | Disposition: A | Discharge status: Home / Self Care | Payer: BLUE CROSS/BLUE SHIELD | Attending: Emergency Medicine | Admitting: Emergency Medicine

## 2017-12-15 ENCOUNTER — Other Ambulatory Visit: Payer: Self-pay

## 2017-12-15 ENCOUNTER — Encounter: Payer: Self-pay | Admitting: Emergency Medicine

## 2017-12-15 ENCOUNTER — Emergency Department: Payer: BLUE CROSS/BLUE SHIELD

## 2017-12-15 DIAGNOSIS — Z7982 Long term (current) use of aspirin: Secondary | ICD-10-CM | POA: Diagnosis not present

## 2017-12-15 DIAGNOSIS — Z794 Long term (current) use of insulin: Secondary | ICD-10-CM | POA: Insufficient documentation

## 2017-12-15 DIAGNOSIS — Z79899 Other long term (current) drug therapy: Secondary | ICD-10-CM | POA: Insufficient documentation

## 2017-12-15 DIAGNOSIS — E119 Type 2 diabetes mellitus without complications: Secondary | ICD-10-CM | POA: Insufficient documentation

## 2017-12-15 DIAGNOSIS — M79604 Pain in right leg: Secondary | ICD-10-CM

## 2017-12-15 DIAGNOSIS — M79661 Pain in right lower leg: Secondary | ICD-10-CM | POA: Diagnosis present

## 2017-12-15 DIAGNOSIS — I10 Essential (primary) hypertension: Secondary | ICD-10-CM | POA: Insufficient documentation

## 2017-12-15 DIAGNOSIS — M7121 Synovial cyst of popliteal space [Baker], right knee: Secondary | ICD-10-CM | POA: Insufficient documentation

## 2017-12-15 MED ORDER — TRAMADOL HCL 50 MG PO TABS
50.0000 mg | ORAL_TABLET | Freq: Once | ORAL | Status: AC
  Administered 2017-12-15: 50 mg via ORAL
  Filled 2017-12-15: qty 1

## 2017-12-15 MED ORDER — TRAMADOL HCL 50 MG PO TABS: 50.0000 mg | ORAL_TABLET | Freq: Four times a day (QID) | ORAL | 0 refills | Status: DC | PRN

## 2017-12-15 NOTE — Discharge Instructions (Signed)
Follow-up with [facility] at [facility] if you are not better in 1 week.  You may want to follow-up with him to have this evaluated as if it needs surgery.  Use the medication as prescribed. You can continue to use the diclofenac with the tramadol.  Apply ice to the back of the knee.  This will help with some of the swelling

## 2017-12-15 NOTE — ED Triage Notes (Signed)
Patient to ER for c/o right knee pain. Patient states has h/o pain to same leg, but not sure if it is "just a flare up". States pain hurts "like a toothache". Patient ambulatory to triage with cane. Patient denies any injury.

## 2017-12-15 NOTE — ED Provider Notes (Signed)
[facility] Emergency Department Provider Note  ____________________________________________   First MD Initiated Contact with Patient 12/15/17 [phone]     (approximate)  I have reviewed the triage vital signs and the nursing notes.   HISTORY  Chief Complaint No chief complaint on file.    HPI Julie Sawyer is a 62 y.o. female complaining of continued right knee and lower leg pain.  States she is not sure if it is just flared up but it is been aching like a toothache.  She states the pain has been keeping her awake.  She is worried about a blood clot.  She denies any injury.  She states when she took the oxycodone she was given the pain was relieved.  The diclofenac does not tend to relieve all of her arthritic pain.  She denies fever chills, chest pain or shortness of breath  Past Medical History:  Diagnosis Date  . Anemia   . Arthritis   . Bursitis   . Diabetes mellitus without complication (HCC)   . Elevated lipids   . Hypertension   . Obesity   . Shortness of breath dyspnea   . Sleep apnea    use CPAP  . Vaginal bleeding     Patient Active Problem List   Diagnosis Date Noted  . Postmenopausal bleeding 04/17/2017  . Simple endometrial hyperplasia without atypia 04/17/2017  . Endometrial polyp 04/17/2017    Past Surgical History:  Procedure Laterality Date  . BREAST BIOPSY Left    neg  . BREAST SURGERY Left   . CESAREAN SECTION  1989  . DILATATION & CURETTAGE/HYSTEROSCOPY WITH MYOSURE N/A 08/28/2017   Procedure: DILATATION & CURETTAGE/HYSTEROSCOPY WITH MYOSURE;  Surgeon: Conard NovakJackson, Stephen D, MD;  Location: [facility] ORS;  Service: Gynecology;  Laterality: N/A;  . DILATION AND CURETTAGE OF UTERUS    . ENDOMETRIAL ABLATION N/A 08/28/2017   Procedure: ENDOMETRIAL POLYPECTOMY;  Surgeon: Conard NovakJackson, Stephen D, MD;  Location: [facility] ORS;  Service: Gynecology;  Laterality: N/A;  . TONSILLECTOMY    . TUBAL LIGATION      Prior to Admission medications     Medication Sig Start Date End Date Taking? Authorizing Provider  aspirin 81 MG tablet Take 81 mg by mouth daily.    [provider]  diclofenac (VOLTAREN) 50 MG EC tablet Take 1 tablet (50 mg total) by mouth 2 (two) times daily. 11/17/17   Tommi RumpsSummers, Rhonda L, PA-C  ferrous sulfate 325 (65 FE) MG EC tablet Take 325 mg by mouth daily with breakfast.     [provider]  gabapentin (NEURONTIN) 300 MG capsule Take 300 mg by mouth 2 (two) times daily.     [provider]  glipiZIDE (GLUCOTROL XL) 10 MG 24 hr tablet Take 10 mg by mouth 2 (two) times daily.     [provider]  hydrALAZINE (APRESOLINE) 25 MG tablet Take 50 mg by mouth 2 (two) times daily. 07/13/17   [provider]  HYDROcodone-acetaminophen (NORCO/VICODIN) 5-325 MG tablet Take 1 tablet by mouth every 6 (six) hours as needed for moderate pain. 11/17/17   Tommi RumpsSummers, Rhonda L, PA-C  insulin detemir (LEVEMIR) 100 UNIT/ML injection Inject 60 Units into the skin 2 (two) times daily.     [provider]  losartan (COZAAR) 100 MG tablet Take 100 mg by mouth daily.    [provider]  medroxyPROGESTERone (PROVERA) 10 MG tablet Take 1 tablet (10 mg total) by mouth daily. 09/11/17 09/23/17  Conard NovakJackson, Stephen D, MD  metoprolol tartrate (LOPRESSOR) 50 MG tablet Take 50 mg by mouth once.    [provider]  metoprolol-hydrochlorothiazide (LOPRESSOR HCT) 100-25 MG tablet Take 1 tablet by mouth daily.    [provider]  NOVOLOG FLEXPEN 100 UNIT/ML FlexPen Inject 2 Units into the skin daily before supper. 10/17/16   [provider]  simvastatin (ZOCOR) 40 MG tablet Take 40 mg by mouth daily at 6 PM.    [provider]  traMADol (ULTRAM) 50 MG tablet Take 1 tablet (50 mg total) by mouth every 6 (six) hours as needed. 12/15/17   Faythe Ghee, PA-C    Allergies Patient has no known allergies.  Family History  Adopted: Yes    Social History Social History    Tobacco Use  . Smoking status: Never Smoker  . Smokeless tobacco: Never Used  Substance Use Topics  . Alcohol use: No  . Drug use: No    Review of Systems  Constitutional: No fever/chills Eyes: No visual changes. ENT: No sore throat. Respiratory: Denies cough Cardiac: Denies chest pain Genitourinary: Negative for dysuria. Musculoskeletal: Negative for back pain.  Positive for right lower leg pain Skin: Negative for rash.    ____________________________________________   PHYSICAL EXAM:  VITAL SIGNS: ED Triage Vitals  Enc Vitals Group     BP 12/15/17 0821 (!) 138/45     Pulse Rate 12/15/17 0821 64     Resp 12/15/17 0821 20     Temp 12/15/17 0821 97.9 F (36.6 C)     Temp Source 12/15/17 0821 Oral     SpO2 12/15/17 0821 98 %     Weight 12/15/17 0823 297 lb (134.7 kg)     Height 12/15/17 0823 5' (1.524 m)     Head Circumference --      Peak Flow --      Pain Score 12/15/17 0821 8     Pain Loc --      Pain Edu? --      Excl. in GC? --     Constitutional: Alert and oriented. Well appearing and in no acute distress. Eyes: Conjunctivae are normal.  Head: Atraumatic. Nose: No congestion/rhinnorhea. Mouth/Throat: Mucous membranes are moist.   Cardiovascular: Normal rate, regular rhythm.  Heart sounds are normal Respiratory: Normal respiratory effort.  No retractions, lungs clear to auscultation GU: deferred Musculoskeletal: FROM all extremities, warm and well perfused, the right lower leg is tender right below the calf and at the posterior knee.  The area feels warm to touch but there is no noted swelling.  Neurovascular appears to be intact Neurologic:  Normal speech and language.  Skin:  Skin is warm, dry and intact. No rash noted. Psychiatric: Mood and affect are normal. Speech and behavior are normal.  ____________________________________________   LABS (all labs ordered are listed, but only abnormal results are displayed)  Labs Reviewed - No data to  display ____________________________________________   ____________________________________________  RADIOLOGY  Ultrasound of the right lower leg to rule out DVT  ____________________________________________   PROCEDURES  Procedure(s) performed: No  Procedures    ____________________________________________   INITIAL IMPRESSION / ASSESSMENT AND PLAN / ED COURSE  Pertinent labs & imaging results that were available during my care of the patient were reviewed by me and considered in my medical decision making (see chart for details).  Patient is a 62 year old female.  Patient is morbidly obese.  She is sedentary.  She is complaining of right lower leg pain.  She was seen here  and diagnosed with arthritic pain.  She is worried it has another flare.  She is denies any new injury.  She is concerned about a blood clot  On physical exam the patient's right lower leg is a little tender to palpation.  She is tender behind the right knee.  There is a negative Homans sign.  Neurovascular is intact  Ultrasound of the right lower leg is ordered due to right lower leg pain combined with the patient's obesity and sedentary lifestyle.  ----------------------------------------- 12:11 PM on 12/15/2017 -----------------------------------------  Ultrasound of the right lower leg is negative for DVT.  Positive for Baker's cyst.  The results were explained to the patient and her family.  She is to follow-up with orthopedics for further evaluation of the Baker's cyst to see if this is something that needs to be removed or can be managed with medication.  The patient states she understands.  She will call make an appointment she was seen at [facility] clinic previously.  She was given a prescription for tramadol 50 mg.  She was cautioned on the use of narcotics.  She could also continue her diclofenac.  Patient states she understands will comply with her treatment plan.  She was discharged in stable  condition.  She was also given a work note.  As part of my medical decision making, I reviewed the following data within the electronic MEDICAL RECORD NUMBER Nursing notes reviewed and incorporated, Old chart reviewed, Radiograph reviewed ultrasound of the right lower leg is negative for DVT, positive for Baker's cyst, Notes from prior ED visits and [place] Controlled Substance Database  ____________________________________________   FINAL CLINICAL IMPRESSION(S) / ED DIAGNOSES  Final diagnoses:  Popliteal cyst, right  Right leg pain      NEW MEDICATIONS STARTED DURING THIS VISIT:  Discharge Medication List as of 12/15/2017 10:49 AM    START taking these medications   Details  traMADol (ULTRAM) 50 MG tablet Take 1 tablet (50 mg total) by mouth every 6 (six) hours as needed., Starting Sat 12/15/2017, Print         Note:  This document was prepared using Dragon voice recognition software and may include unintentional dictation errors.    Faythe Ghee, PA-C 12/15/17 1212    Jeanmarie Plant, MD 12/15/17 [phone]

## 2018-07-01 ENCOUNTER — Other Ambulatory Visit: Payer: Self-pay | Admitting: Family Medicine

## 2018-07-01 ENCOUNTER — Ambulatory Visit
Admission: RE | Admit: 2018-07-01 | Discharge: 2018-07-01 | Disposition: A | Discharge status: Home / Self Care | Payer: BLUE CROSS/BLUE SHIELD | Source: Ambulatory Visit | Attending: Family Medicine | Admitting: Family Medicine

## 2018-07-01 DIAGNOSIS — M25572 Pain in left ankle and joints of left foot: Secondary | ICD-10-CM | POA: Insufficient documentation

## 2018-07-16 ENCOUNTER — Encounter: Payer: BLUE CROSS/BLUE SHIELD | Attending: Family Medicine | Admitting: *Deleted

## 2018-07-16 ENCOUNTER — Encounter: Payer: Self-pay | Admitting: *Deleted

## 2018-07-16 VITALS — BP 160/80 | Ht 60.0 in | Wt 295.9 lb

## 2018-07-16 DIAGNOSIS — Z713 Dietary counseling and surveillance: Secondary | ICD-10-CM | POA: Diagnosis not present

## 2018-07-16 DIAGNOSIS — E119 Type 2 diabetes mellitus without complications: Secondary | ICD-10-CM

## 2018-07-16 NOTE — Patient Instructions (Signed)
Check blood sugars 2 x day before breakfast and before supper every day (occasionally check 2 hours after one meal)  Bring blood sugar records to the next class  Exercise: Walk as tolerated  Eat 3 meals day, 1 snack a day Space meals 4-6 hours apart Don't skip meals  Carry fast acting glucose and a snack at all times Rotate injection sites Hold pens in place for 5-10 seconds after injection  Return for classes on:

## 2018-07-16 NOTE — Progress Notes (Signed)
Diabetes Self-Management Education  Visit Type: First/Initial  Appt. Start Time: 1045 Appt. End Time:  1200  07/16/2018  Julie Sawyer, identified by name and date of birth, is a 62 y.o. female with a diagnosis of Diabetes: Type 2.   ASSESSMENT  Blood pressure (!) 160/80, height 5' (1.524 m), weight 295 lb 14.4 oz (134.2 kg). Body mass index is 57.79 kg/m.  Diabetes Self-Management Education - 07/16/18 1228      Visit Information   Visit Type  First/Initial      Initial Visit   Diabetes Type  Type 2    Are you currently following a meal plan?  No    Are you taking your medications as prescribed?  Yes    Date Diagnosed  more than 10 years ago      Health Coping   How would you rate your overall health?  Good      Psychosocial Assessment   Patient Belief/Attitude about Diabetes  Motivated to manage diabetes    Self-care barriers  Unsteady gait/risk for falls   Pt reports she has a left ankle sprain. Uses a cane.    Self-management support  Doctor's office;Family    Patient Concerns  Nutrition/Meal planning;Medication;Monitoring;Healthy Lifestyle;Problem Solving;Glycemic Control;Weight Control    Special Needs  None    Preferred Learning Style  Auditory;Visual;Hands on    Learning Readiness  Change in progress    How often do you need to have someone help you when you read instructions, pamphlets, or other written materials from your doctor or pharmacy?  1 - Never    What is the last grade level you completed in school?  Assoc degree      Pre-Education Assessment   Patient understands the diabetes disease and treatment process.  Needs Instruction    Patient understands incorporating nutritional management into lifestyle.  Needs Instruction    Patient undertands incorporating physical activity into lifestyle.  Needs Instruction    Patient understands using medications safely.  Needs Instruction    Patient understands monitoring blood glucose, interpreting and using results   Needs Review    Patient understands prevention, detection, and treatment of acute complications.  Needs Review    Patient understands prevention, detection, and treatment of chronic complications.  Needs Instruction    Patient understands how to develop strategies to address psychosocial issues.  Needs Instruction      Complications   Last HgB A1C per patient/outside source  --   Pt reports last A1C was less than 7 %.   How often do you check your blood sugar?  1-2 times/day    Fasting Blood glucose range (mg/dL)  35-361   Pt reports FBG's 90-130 mg/dL.   Postprandial Blood glucose range (mg/dL)  --   Pt reports pre-supper usually 90-130 mg/dL.    Have you had a dilated eye exam in the past 12 months?  Yes    Have you had a dental exam in the past 12 months?  Yes    Are you checking your feet?  Yes    How many days per week are you checking your feet?  7      Dietary Intake   Breakfast  skips or eats fruit; sometimes cereal and milk; oatmeal    Lunch  skips or has main meal - baked chicken, beef, pork, fish, occasional potatoes, rice or pasta; peas, corn, beans, non-starchy vegetables    Snack (afternoon)  fruit (apple sauce, apples, grapes, bananas, dried fruit, nuts -  trail mix    Dinner  peanut butter sandwich, salad, chicken or just non-starchy vegetables    Beverage(s)  water, tea and lemonade with artificial sweeteners      Exercise   Exercise Type  ADL's      Patient Education   Previous Diabetes Education  Yes (please comment)   "Years ago I had 1 class"   Disease state   Definition of diabetes, type 1 and 2, and the diagnosis of diabetes;Factors that contribute to the development of diabetes    Nutrition management   Role of diet in the treatment of diabetes and the relationship between the three main macronutrients and blood glucose level;Reviewed blood glucose goals for pre and post meals and how to evaluate the patients' food intake on their blood glucose level.     Physical activity and exercise   Role of exercise on diabetes management, blood pressure control and cardiac health.    Medications  Taught/reviewed insulin injection, site rotation, insulin storage and needle disposal.;Reviewed patients medication for diabetes, action, purpose, timing of dose and side effects.    Monitoring  Purpose and frequency of SMBG.;Taught/discussed recording of test results and interpretation of SMBG.;Identified appropriate SMBG and/or A1C goals.    Acute complications  Taught treatment of hypoglycemia - the 15 rule.    Chronic complications  Relationship between chronic complications and blood glucose control    Psychosocial adjustment  Identified and addressed patients feelings and concerns about diabetes      Individualized Goals (developed by patient)   Reducing Risk  Improve blood sugars Decrease medications Prevent diabetes complications Lose weight Lead a healthier lifestyle     Outcomes   Expected Outcomes  Demonstrated interest in learning. Expect positive outcomes       Individualized Plan for Diabetes Self-Management Training:   Learning Objective:  Patient will have a greater understanding of diabetes self-management. Patient education plan is to attend individual and/or group sessions per assessed needs and concerns.   Plan:   Patient Instructions  Check blood sugars 2 x day before breakfast and before supper every day (occasionally check 2 hours after one meal)  Bring blood sugar records to the next class Exercise: Walk as tolerated Eat 3 meals day, 1 snack a day Space meals 4-6 hours apart Don't skip meals Carry fast acting glucose and a snack at all times Rotate injection sites Hold pens in place for 5-10 seconds after injection   Expected Outcomes:  Demonstrated interest in learning. Expect positive outcomes  Education material provided:  General Meal Planning Guidelines Simple Meal Plan Symptoms, causes and treatments of  Hypoglycemia  If problems or questions, patient to contact team via:  Sharion Settler, RN, CCM, CDE [phone]  Future DSME appointment:  Thursday August 08, 2018 for Diabetes Class 1

## 2018-08-08 ENCOUNTER — Ambulatory Visit: Payer: BLUE CROSS/BLUE SHIELD

## 2018-08-15 ENCOUNTER — Ambulatory Visit: Payer: BLUE CROSS/BLUE SHIELD

## 2018-08-22 ENCOUNTER — Ambulatory Visit: Payer: BLUE CROSS/BLUE SHIELD

## 2018-08-29 ENCOUNTER — Encounter: Payer: BLUE CROSS/BLUE SHIELD | Attending: Family Medicine | Admitting: Dietician

## 2018-08-29 ENCOUNTER — Encounter: Payer: Self-pay | Admitting: Dietician

## 2018-08-29 VITALS — Ht 60.0 in | Wt 297.9 lb

## 2018-08-29 DIAGNOSIS — E119 Type 2 diabetes mellitus without complications: Secondary | ICD-10-CM | POA: Insufficient documentation

## 2018-08-29 DIAGNOSIS — Z713 Dietary counseling and surveillance: Secondary | ICD-10-CM | POA: Diagnosis not present

## 2018-08-29 NOTE — Progress Notes (Signed)

## 2018-09-05 ENCOUNTER — Encounter: Payer: BLUE CROSS/BLUE SHIELD | Admitting: *Deleted

## 2018-09-05 ENCOUNTER — Encounter: Payer: Self-pay | Admitting: *Deleted

## 2018-09-05 VITALS — Wt 301.0 lb

## 2018-09-05 DIAGNOSIS — Z713 Dietary counseling and surveillance: Secondary | ICD-10-CM | POA: Diagnosis not present

## 2018-09-05 DIAGNOSIS — E119 Type 2 diabetes mellitus without complications: Secondary | ICD-10-CM

## 2018-09-05 NOTE — Progress Notes (Signed)

## 2018-09-12 ENCOUNTER — Encounter: Payer: BLUE CROSS/BLUE SHIELD | Admitting: Dietician

## 2018-09-12 ENCOUNTER — Encounter: Payer: Self-pay | Admitting: Dietician

## 2018-09-12 VITALS — BP 166/78 | Ht 60.0 in | Wt 299.5 lb

## 2018-09-12 DIAGNOSIS — Z713 Dietary counseling and surveillance: Secondary | ICD-10-CM | POA: Diagnosis not present

## 2018-09-12 DIAGNOSIS — E119 Type 2 diabetes mellitus without complications: Secondary | ICD-10-CM

## 2018-09-12 NOTE — Progress Notes (Signed)

## 2018-09-24 ENCOUNTER — Encounter: Payer: Self-pay | Admitting: *Deleted

## 2018-11-30 ENCOUNTER — Other Ambulatory Visit: Payer: Self-pay

## 2018-11-30 ENCOUNTER — Encounter: Payer: Self-pay | Admitting: Emergency Medicine

## 2018-11-30 ENCOUNTER — Emergency Department
Admission: EM | Admit: 2018-11-30 | Discharge: 2018-11-30 | Disposition: A | Discharge status: Home / Self Care | Payer: BLUE CROSS/BLUE SHIELD | Attending: Emergency Medicine | Admitting: Emergency Medicine

## 2018-11-30 ENCOUNTER — Emergency Department: Payer: BLUE CROSS/BLUE SHIELD

## 2018-11-30 DIAGNOSIS — S93402A Sprain of unspecified ligament of left ankle, initial encounter: Secondary | ICD-10-CM | POA: Insufficient documentation

## 2018-11-30 DIAGNOSIS — X501XXA Overexertion from prolonged static or awkward postures, initial encounter: Secondary | ICD-10-CM | POA: Insufficient documentation

## 2018-11-30 DIAGNOSIS — Z87891 Personal history of nicotine dependence: Secondary | ICD-10-CM | POA: Insufficient documentation

## 2018-11-30 DIAGNOSIS — Y929 Unspecified place or not applicable: Secondary | ICD-10-CM | POA: Diagnosis not present

## 2018-11-30 DIAGNOSIS — Y9389 Activity, other specified: Secondary | ICD-10-CM | POA: Insufficient documentation

## 2018-11-30 DIAGNOSIS — E119 Type 2 diabetes mellitus without complications: Secondary | ICD-10-CM | POA: Diagnosis not present

## 2018-11-30 DIAGNOSIS — Y998 Other external cause status: Secondary | ICD-10-CM | POA: Insufficient documentation

## 2018-11-30 DIAGNOSIS — Z7982 Long term (current) use of aspirin: Secondary | ICD-10-CM | POA: Diagnosis not present

## 2018-11-30 DIAGNOSIS — Z79899 Other long term (current) drug therapy: Secondary | ICD-10-CM | POA: Insufficient documentation

## 2018-11-30 DIAGNOSIS — M7732 Calcaneal spur, left foot: Secondary | ICD-10-CM | POA: Insufficient documentation

## 2018-11-30 DIAGNOSIS — Z794 Long term (current) use of insulin: Secondary | ICD-10-CM | POA: Insufficient documentation

## 2018-11-30 DIAGNOSIS — I10 Essential (primary) hypertension: Secondary | ICD-10-CM | POA: Insufficient documentation

## 2018-11-30 DIAGNOSIS — S99912A Unspecified injury of left ankle, initial encounter: Secondary | ICD-10-CM | POA: Diagnosis present

## 2018-11-30 MED ORDER — TRAMADOL HCL 50 MG PO TABS: 50.0000 mg | ORAL_TABLET | Freq: Four times a day (QID) | ORAL | 0 refills | Status: AC | PRN

## 2018-11-30 MED ORDER — IBUPROFEN 600 MG PO TABS: 600.0000 mg | ORAL_TABLET | Freq: Three times a day (TID) | ORAL | 0 refills | Status: DC | PRN

## 2018-11-30 MED ORDER — IBUPROFEN 600 MG PO TABS
600.0000 mg | ORAL_TABLET | Freq: Once | ORAL | Status: AC
  Administered 2018-11-30: 600 mg via ORAL
  Filled 2018-11-30: qty 1

## 2018-11-30 MED ORDER — TRAMADOL HCL 50 MG PO TABS
50.0000 mg | ORAL_TABLET | Freq: Once | ORAL | Status: AC
  Administered 2018-11-30: 50 mg via ORAL
  Filled 2018-11-30: qty 1

## 2018-11-30 NOTE — ED Provider Notes (Signed)
[facility] Emergency Department Provider Note   ____________________________________________   First MD Initiated Contact with Patient 11/30/18 [phone]     (approximate)  I have reviewed the triage vital signs and the nursing notes.   HISTORY  Chief Complaint Ankle Pain    HPI Julie Sawyer is a 63 y.o. female patient presents with left ankle pain and swelling secondary to a twisting incident yesterday.  Patient state pain increased with ambulation.  Patient denies loss of sensation.  Patient rates pain as 8/10.  Patient described the pain as "achy".  No palliative measures prior to arrival.  Past Medical History:  Diagnosis Date  . Anemia   . Arthritis   . Bursitis   . Diabetes mellitus without complication (HCC)   . Elevated lipids   . Hypertension   . Obesity   . Shortness of breath dyspnea   . Sleep apnea    use CPAP  . Vaginal bleeding     Patient Active Problem List   Diagnosis Date Noted  . Postmenopausal bleeding 04/17/2017  . Simple endometrial hyperplasia without atypia 04/17/2017  . Endometrial polyp 04/17/2017    Past Surgical History:  Procedure Laterality Date  . BREAST BIOPSY Left    neg  . BREAST SURGERY Left   . CESAREAN SECTION  1989  . DILATATION & CURETTAGE/HYSTEROSCOPY WITH MYOSURE N/A 08/28/2017   Procedure: DILATATION & CURETTAGE/HYSTEROSCOPY WITH MYOSURE;  Surgeon: Conard Novak, MD;  Location: [facility] ORS;  Service: Gynecology;  Laterality: N/A;  . DILATION AND CURETTAGE OF UTERUS    . ENDOMETRIAL ABLATION N/A 08/28/2017   Procedure: ENDOMETRIAL POLYPECTOMY;  Surgeon: Conard Novak, MD;  Location: [facility] ORS;  Service: Gynecology;  Laterality: N/A;  . TONSILLECTOMY    . TUBAL LIGATION      Prior to Admission medications   Medication Sig Start Date End Date Taking? Authorizing Provider  aspirin 81 MG tablet Take 81 mg by mouth daily.    [provider]  cloNIDine (CATAPRES) 0.1 MG tablet Take  0.1 mg by mouth 2 (two) times daily. 11/19/17   [provider]  furosemide (LASIX) 20 MG tablet Take 20 mg by mouth 2 (two) times daily. 05/29/18   [provider]  gabapentin (NEURONTIN) 300 MG capsule Take 300 mg by mouth 2 (two) times daily.     [provider]  hydrALAZINE (APRESOLINE) 100 MG tablet Take 100 mg by mouth 2 (two) times daily. 11/15/17   [provider]  ibuprofen (ADVIL,MOTRIN) 600 MG tablet Take 1 tablet (600 mg total) by mouth every 8 (eight) hours as needed. 11/30/18   Joni Reining, PA-C  indomethacin (INDOCIN) 50 MG capsule Take 50 mg by mouth 2 (two) times daily as needed.  08/07/18   [provider]  insulin detemir (LEVEMIR) 100 UNIT/ML injection Inject 60 Units into the skin 2 (two) times daily.     [provider]  liraglutide (VICTOZA) 18 MG/3ML SOPN Inject 1.2 mg into the skin daily. 12/11/17   [provider]  losartan (COZAAR) 100 MG tablet Take 100 mg by mouth daily.    [provider]  metoprolol succinate (TOPROL-XL) 50 MG 24 hr tablet Take 50 mg by mouth daily. 08/26/17   [provider]  simvastatin (ZOCOR) 40 MG tablet Take 40 mg by mouth daily at 6 PM.    [provider]  traMADol (ULTRAM) 50 MG tablet Take 1 tablet (50 mg total) by mouth every 6 (six)  hours as needed. 11/30/18 11/30/19  Joni ReiningSmith,  K, PA-C    Allergies Patient has no known allergies.  Family History  Adopted: Yes    Social History Social History   Tobacco Use  . Smoking status: Former Smoker    Packs/day: 0.75    Years: 20.00    Pack years: 15.00    Last attempt to quit: 11/13/1997    Years since quitting: 21.0  . Smokeless tobacco: Never Used  Substance Use Topics  . Alcohol use: No  . Drug use: No    Review of Systems Constitutional: No fever/chills Eyes: No visual changes. ENT: No sore throat. Cardiovascular: Denies chest pain. Respiratory: Denies shortness of  breath. Gastrointestinal: No abdominal pain.  No nausea, no vomiting.  No diarrhea.  No constipation. Genitourinary: Negative for dysuria. Musculoskeletal: Negative for back pain. Skin: Negative for rash. Neurological: Negative for headaches, focal weakness or numbness. Endocrine: Diabetes, hyperlipidemia, and hypertension.. Hematological/Lymphatic:Anemia.   ____________________________________________   PHYSICAL EXAM:  VITAL SIGNS: ED Triage Vitals  Enc Vitals Group     BP 11/30/18 0857 (!) 157/61     Pulse Rate 11/30/18 0857 72     Resp 11/30/18 0857 18     Temp 11/30/18 0857 98.4 F (36.9 C)     Temp Source 11/30/18 0857 Oral     SpO2 11/30/18 0857 94 %     Weight 11/30/18 0858 295 lb (133.8 kg)     Height 11/30/18 0858 5' (1.524 m)     Head Circumference --      Peak Flow --      Pain Score 11/30/18 0858 8     Pain Loc --      Pain Edu? --      Excl. in GC? --    Constitutional: Alert and oriented. Well appearing and in no acute distress.  Morbid obesity. Cardiovascular: Normal rate, regular rhythm. Grossly normal heart sounds.  Good peripheral circulation. Respiratory: Normal respiratory effort.  No retractions. Lungs CTAB. Musculoskeletal: No obvious deformity to the left ankle.  Mild edema.  Patient has full and equal range of motion.  Patient moderate guarding palpation lateral malleolus and plantar aspect of the heel.   Neurologic:  Normal speech and language. No gross focal neurologic deficits are appreciated. No gait instability. Skin:  Skin is warm, dry and intact. No rash noted. Psychiatric: Mood and affect are normal. Speech and behavior are normal.  ____________________________________________   LABS (all labs ordered are listed, but only abnormal results are displayed)  Labs Reviewed - No data to display ____________________________________________  EKG   ____________________________________________  RADIOLOGY  ED MD interpretation:     Official radiology report(s): Dg Ankle Complete Left  Result Date: 11/30/2018 CLINICAL DATA:  Fall, twisting ankle injury EXAM: LEFT ANKLE COMPLETE - 3+ VIEW COMPARISON:  None. FINDINGS: Mild soft tissue swelling. No acute osseous finding, malalignment, or fracture. Distal tibia, fibula, talus and calcaneus appear intact. Small plantar calcaneal spur. IMPRESSION: No acute finding by plain radiography. Mild soft tissue swelling. Electronically Signed   By: Judie PetitM.  Shick M.D.   On: 11/30/2018 09:35    ____________________________________________   PROCEDURES  Procedure(s) performed: None  Procedures  Critical Care performed: No  ____________________________________________   INITIAL IMPRESSION / ASSESSMENT AND PLAN / ED COURSE  As part of my medical decision making, I reviewed the following data within the electronic MEDICAL RECORD NUMBER    Left ankle pain secondary to sprain.  Incidental finding of heel spurs.  Discussed  x-ray findings with patient.  Patient given discharge care instruction.  Patient placed in ankle stirrup splint and given prescription for tramadol ibuprofen.  Patient advised follow-up with podiatry for further evaluation of heel spur.      ____________________________________________   FINAL CLINICAL IMPRESSION(S) / ED DIAGNOSES  Final diagnoses:  Sprain of left ankle, unspecified ligament, initial encounter  Heel spur, left     ED Discharge Orders         Ordered    ibuprofen (ADVIL,MOTRIN) 600 MG tablet  Every 8 hours PRN     11/30/18 0948    traMADol (ULTRAM) 50 MG tablet  Every 6 hours PRN     11/30/18 0948           Note:  This document was prepared using Dragon voice recognition software and may include unintentional dictation errors.    Joni Reining, PA-C 11/30/18 5809    Schaevitz, Myra Rude, MD 11/30/18 1351

## 2018-11-30 NOTE — ED Notes (Signed)
See triage note  Presents with pain to left ankle  States she lost her balance and fell back on step  Twisted ankle   Min swelling good pulses

## 2018-11-30 NOTE — ED Triage Notes (Signed)
L ankle pain, twisted on step yesterday.

## 2018-12-17 ENCOUNTER — Encounter: Payer: Self-pay | Admitting: Emergency Medicine

## 2018-12-17 ENCOUNTER — Emergency Department
Admission: EM | Admit: 2018-12-17 | Discharge: 2018-12-17 | Disposition: A | Discharge status: Home / Self Care | Payer: BLUE CROSS/BLUE SHIELD | Attending: Emergency Medicine | Admitting: Emergency Medicine

## 2018-12-17 ENCOUNTER — Emergency Department: Payer: BLUE CROSS/BLUE SHIELD

## 2018-12-17 DIAGNOSIS — M542 Cervicalgia: Secondary | ICD-10-CM | POA: Diagnosis present

## 2018-12-17 DIAGNOSIS — R51 Headache: Secondary | ICD-10-CM | POA: Diagnosis not present

## 2018-12-17 DIAGNOSIS — E119 Type 2 diabetes mellitus without complications: Secondary | ICD-10-CM | POA: Insufficient documentation

## 2018-12-17 DIAGNOSIS — Z87891 Personal history of nicotine dependence: Secondary | ICD-10-CM | POA: Diagnosis not present

## 2018-12-17 DIAGNOSIS — M5412 Radiculopathy, cervical region: Secondary | ICD-10-CM | POA: Diagnosis not present

## 2018-12-17 DIAGNOSIS — I10 Essential (primary) hypertension: Secondary | ICD-10-CM | POA: Diagnosis not present

## 2018-12-17 DIAGNOSIS — M436 Torticollis: Secondary | ICD-10-CM | POA: Diagnosis not present

## 2018-12-17 DIAGNOSIS — Z79899 Other long term (current) drug therapy: Secondary | ICD-10-CM | POA: Diagnosis not present

## 2018-12-17 DIAGNOSIS — Z7982 Long term (current) use of aspirin: Secondary | ICD-10-CM | POA: Diagnosis not present

## 2018-12-17 MED ORDER — LIDOCAINE 5 % EX PTCH: 1.0000 | MEDICATED_PATCH | CUTANEOUS | 0 refills | Status: DC

## 2018-12-17 MED ORDER — LIDOCAINE 5 % EX PTCH
1.0000 | MEDICATED_PATCH | CUTANEOUS | Status: DC
  Administered 2018-12-17: 1 via TRANSDERMAL
  Filled 2018-12-17: qty 1

## 2018-12-17 MED ORDER — DIAZEPAM 5 MG PO TABS
5.0000 mg | ORAL_TABLET | Freq: Once | ORAL | Status: AC
  Administered 2018-12-17: 5 mg via ORAL
  Filled 2018-12-17: qty 1

## 2018-12-17 MED ORDER — CYCLOBENZAPRINE HCL 10 MG PO TABS: 10.0000 mg | ORAL_TABLET | Freq: Three times a day (TID) | ORAL | 0 refills | Status: DC | PRN

## 2018-12-17 NOTE — ED Triage Notes (Signed)
Pt arrived to the ED accompanied by her daughter for complaints of neck pain and headache. Pt denies injury and saw her primary for the same today. Pt is AOx4 in no apparent distress, denies any other symptoms.

## 2018-12-18 NOTE — ED Provider Notes (Addendum)
[facility] Emergency Department Provider Note ____   First MD Initiated Contact with Patient 12/17/18 0402     (approximate)  I have reviewed the triage vital signs and the nursing notes.   HISTORY  Chief Complaint Neck Pain and Headache   HPI Julie Sawyer is a 63 y.o. female presents to the emergency department 3-day history of right side neck pain is worse with any movement of the neck and head.  Patient also admits to occipital headache on the same side.  Patient denies weakness numbness gait instability or visual changes.  Patient denies any nausea or vomiting.  Patient states that she believes she may have pulled a muscle while at work   Past Medical History:  Diagnosis Date  . Anemia   . Arthritis   . Bursitis   . Diabetes mellitus without complication (HCC)   . Elevated lipids   . Hypertension   . Obesity   . Shortness of breath dyspnea   . Sleep apnea    use CPAP  . Vaginal bleeding     Patient Active Problem List   Diagnosis Date Noted  . Postmenopausal bleeding 04/17/2017  . Simple endometrial hyperplasia without atypia 04/17/2017  . Endometrial polyp 04/17/2017    Past Surgical History:  Procedure Laterality Date  . BREAST BIOPSY Left    neg  . BREAST SURGERY Left   . CESAREAN SECTION  1989  . DILATATION & CURETTAGE/HYSTEROSCOPY WITH MYOSURE N/A 08/28/2017   Procedure: DILATATION & CURETTAGE/HYSTEROSCOPY WITH MYOSURE;  Surgeon: Conard Novak, MD;  Location: [facility] ORS;  Service: Gynecology;  Laterality: N/A;  . DILATION AND CURETTAGE OF UTERUS    . ENDOMETRIAL ABLATION N/A 08/28/2017   Procedure: ENDOMETRIAL POLYPECTOMY;  Surgeon: Conard Novak, MD;  Location: [facility] ORS;  Service: Gynecology;  Laterality: N/A;  . TONSILLECTOMY    . TUBAL LIGATION      Prior to Admission medications   Medication Sig Start Date End Date Taking? Authorizing Provider  aspirin 81 MG tablet Take 81 mg by mouth daily.    [provider]  cloNIDine (CATAPRES) 0.1 MG tablet Take 0.1 mg by mouth 2 (two) times daily. 11/19/17   [provider]  cyclobenzaprine (FLEXERIL) 10 MG tablet Take 1 tablet (10 mg total) by mouth 3 (three) times daily as needed. 12/17/18   Darci Current, MD  furosemide (LASIX) 20 MG tablet Take 20 mg by mouth 2 (two) times daily. 05/29/18   [provider]  gabapentin (NEURONTIN) 300 MG capsule Take 300 mg by mouth 2 (two) times daily.     [provider]  hydrALAZINE (APRESOLINE) 100 MG tablet Take 100 mg by mouth 2 (two) times daily. 11/15/17   [provider]  ibuprofen (ADVIL,MOTRIN) 600 MG tablet Take 1 tablet (600 mg total) by mouth every 8 (eight) hours as needed. 11/30/18   Joni Reining, PA-C  indomethacin (INDOCIN) 50 MG capsule Take 50 mg by mouth 2 (two) times daily as needed.  08/07/18   [provider]  insulin detemir (LEVEMIR) 100 UNIT/ML injection Inject 60 Units into the skin 2 (two) times daily.     [provider]  lidocaine (LIDODERM) 5 % Place 1 patch onto the skin daily. Remove & Discard patch within 12 hours or as directed by MD 12/17/18   Darci Current, MD  liraglutide (VICTOZA) 18 MG/3ML SOPN Inject 1.2 mg into the skin daily. 12/11/17   [provider]  losartan (COZAAR) 100 MG tablet Take 100 mg by mouth daily.    [provider]  metoprolol succinate (TOPROL-XL) 50 MG 24 hr tablet Take 50 mg by mouth daily. 08/26/17   [provider]  simvastatin (ZOCOR) 40 MG tablet Take 40 mg by mouth daily at 6 PM.    [provider]  traMADol (ULTRAM) 50 MG tablet Take 1 tablet (50 mg total) by mouth every 6 (six) hours as needed. 11/30/18 11/30/19  Joni Reining, PA-C    Allergies Patient has no known allergies.  Family History  Adopted: Yes    Social History Social History   Tobacco Use  . Smoking status: Former Smoker    Packs/day: 0.75    Years: 20.00    Pack years: 15.00     Last attempt to quit: 11/13/1997    Years since quitting: 21.1  . Smokeless tobacco: Never Used  Substance Use Topics  . Alcohol use: No  . Drug use: No    Review of Systems Constitutional: No fever/chills Eyes: No visual changes. ENT: No sore throat. Cardiovascular: Denies chest pain. Respiratory: Denies shortness of breath. Gastrointestinal: No abdominal pain.  No nausea, no vomiting.  No diarrhea.  No constipation. Genitourinary: Negative for dysuria. Musculoskeletal: Positive for neck pain.  Negative for back pain. Integumentary: Negative for rash. Neurological: Positive for headaches, negative for focal weakness or numbness.   ____________________________________________   PHYSICAL EXAM:  VITAL SIGNS: ED Triage Vitals  Enc Vitals Group     BP 12/17/18 0113 (!) 160/43     Pulse Rate 12/17/18 0113 71     Resp 12/17/18 0113 20     Temp 12/17/18 0113 99 F (37.2 C)     Temp Source 12/17/18 0113 Oral     SpO2 12/17/18 0113 95 %     Weight 12/17/18 0114 134.7 kg (297 lb)     Height 12/17/18 0114 1.524 m (5')     Head Circumference --      Peak Flow --      Pain Score 12/17/18 0114 8     Pain Loc --      Pain Edu? --      Excl. in GC? --     Constitutional: Alert and oriented. Well appearing and in no acute distress. Eyes: Conjunctivae are normal. PERRL. EOMI. Mouth/Throat: Mucous membranes are moist.  Oropharynx non-erythematous. Neck: No stridor.  Pain to palpation right trapezius and sternocleidomastoid muscle.  Pain with active and passive range of motion of the neck  cardiovascular: Normal rate, regular rhythm. Good peripheral circulation. Grossly normal heart sounds. Respiratory: Normal respiratory effort.  No retractions. Lungs CTAB. Gastrointestinal: Soft and nontender. No distention.  Musculoskeletal: Pain with palpation right trapezius and sternocleidomastoid muscles Neurologic:  Normal speech and language. No gross focal neurologic deficits are  appreciated.  Skin:  Skin is warm, dry and intact. No rash noted.  ______________  RADIOLOGY I, Darci Current, personally viewed and evaluated these images (plain radiographs) as part of my medical decision making, as well as reviewing the written report by the radiologist.  ED MD interpretation: No acute intracranial abnormality noted on CT head severe right C7-T1 neural foramen narrowing on CT cervical spine per radiologist  Official radiology report(s): No results found.   Procedures   ____________________________________________   INITIAL IMPRESSION / ASSESSMENT AND PLAN / ED COURSE  As part of my medical decision making, I reviewed the following data within the electronic MEDICAL RECORD NUMBER  63 year old female presented with above-stated history and physical exam secondary to right neck pain.  Suspect torticollis however also considered possibility of cervical radiculopathy.  CT revealed C7-T1 neural foramen narrowing.  Patient with no neurological deficits in the right arm/hand.  Lidoderm patch applied to the patient's right trapezius muscle and Valium 5 mg given.  On reevaluation patient states that pain is much better. ____________________________________________  FINAL CLINICAL IMPRESSION(S) / ED DIAGNOSES  Final diagnoses:  Cervical radiculopathy at C7  Acute torticollis     MEDICATIONS GIVEN DURING THIS VISIT:  Medications  diazepam (VALIUM) tablet 5 mg (5 mg Oral Given 12/17/18 0423)     ED Discharge Orders         Ordered    cyclobenzaprine (FLEXERIL) 10 MG tablet  3 times daily PRN     12/17/18 0529    lidocaine (LIDODERM) 5 %  Every 24 hours     12/17/18 0529           Note:  This document was prepared using Dragon voice recognition software and may include unintentional dictation errors.    Darci CurrentBrown, Anderson N, MD 12/18/18 [phone]    Darci CurrentBrown, [place] N, MD 12/18/18 [phone]

## 2019-11-15 IMAGING — US US EXTREM LOW VENOUS*R*
1 series · 13 of 24 positions shown · non-contrast
Comparison: None.

CLINICAL DATA: Right lower leg/knee pain x3 weeks



[Series 1: us extrem low venous*right* · 0.09mm/px · 13 of 40 slices shown]
[im 1/40]
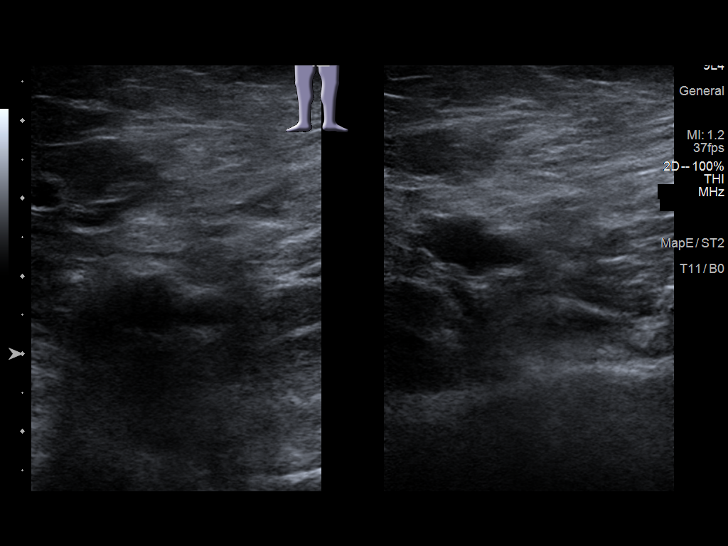
[im 4/40]
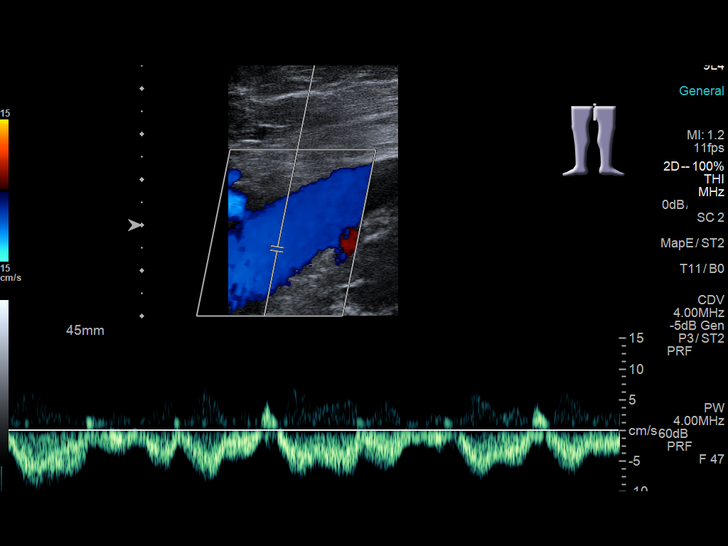
[im 7/40]
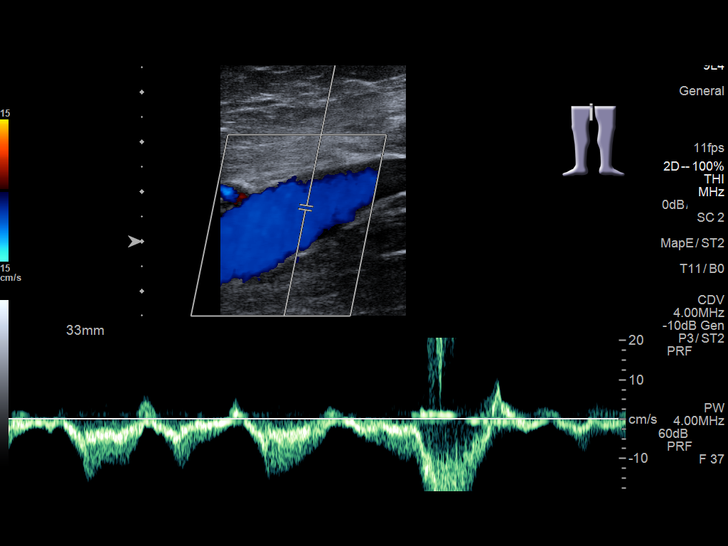
[im 11/40]
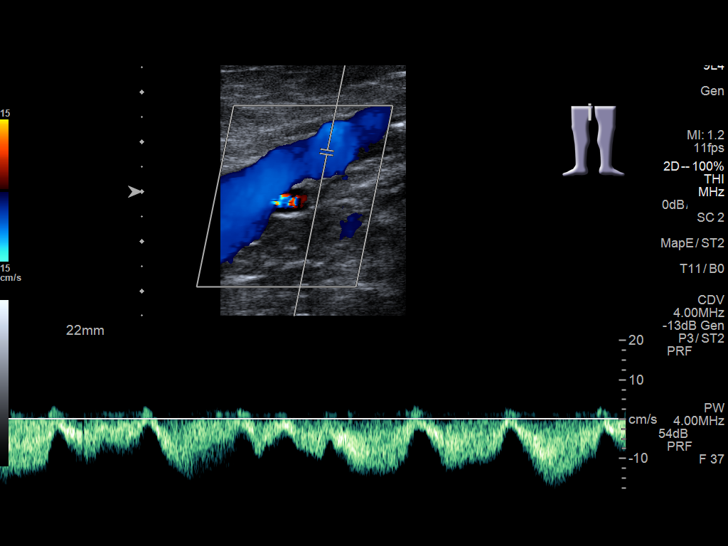
[im 14/40]
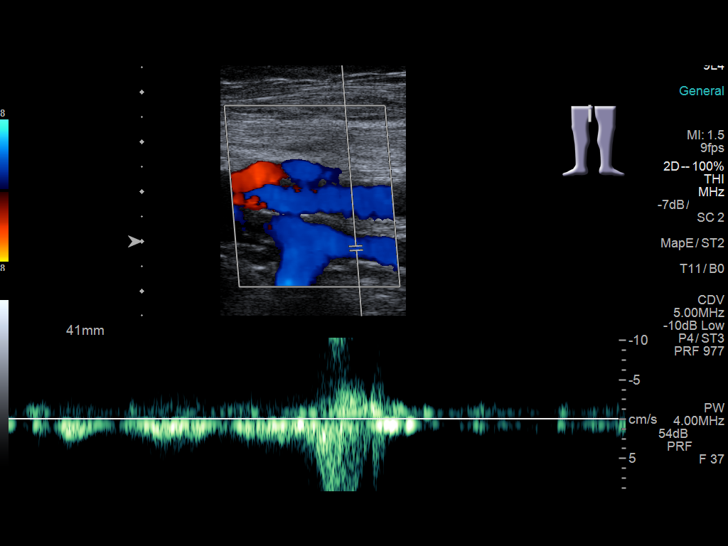
[im 17/40]
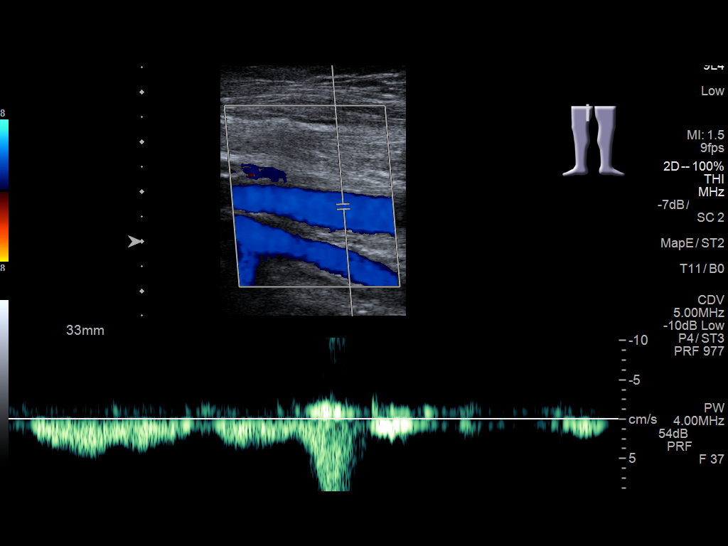
[im 21/40]
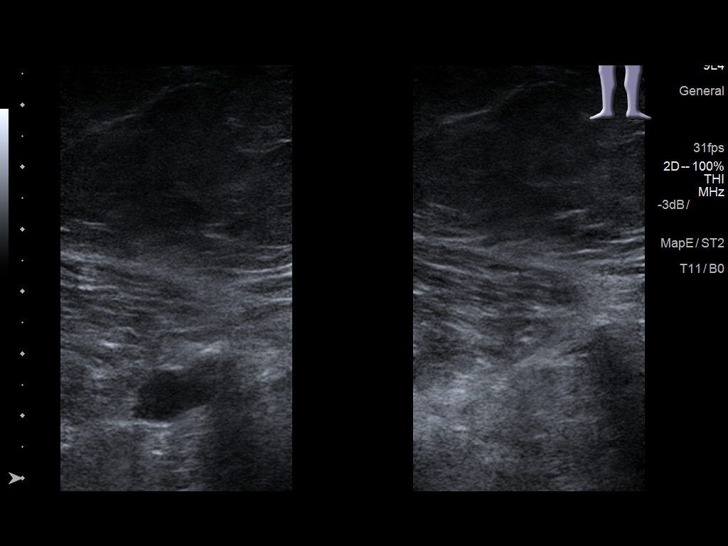
[im 23/40]
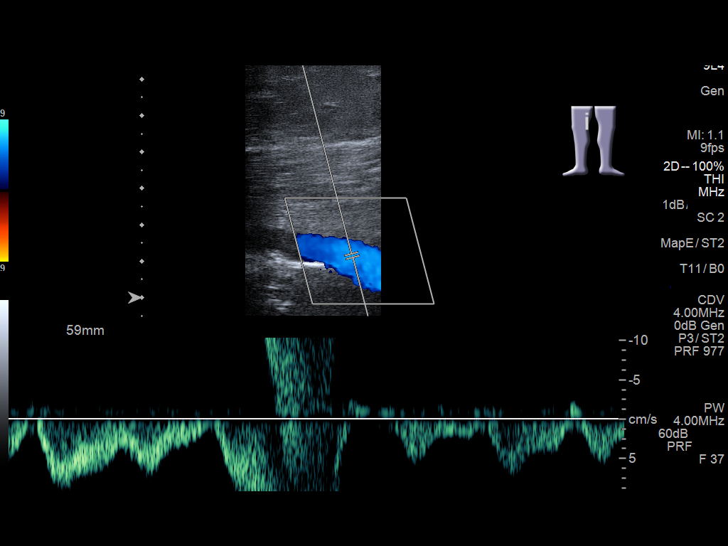
[im 26/40]
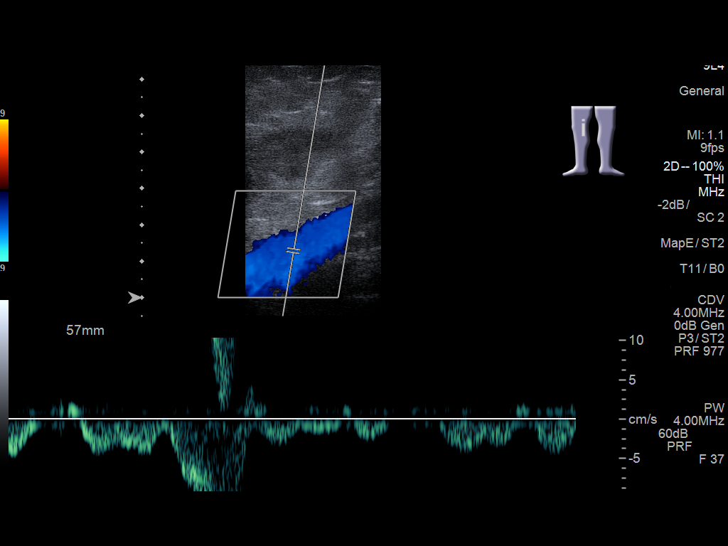
[im 29/40]
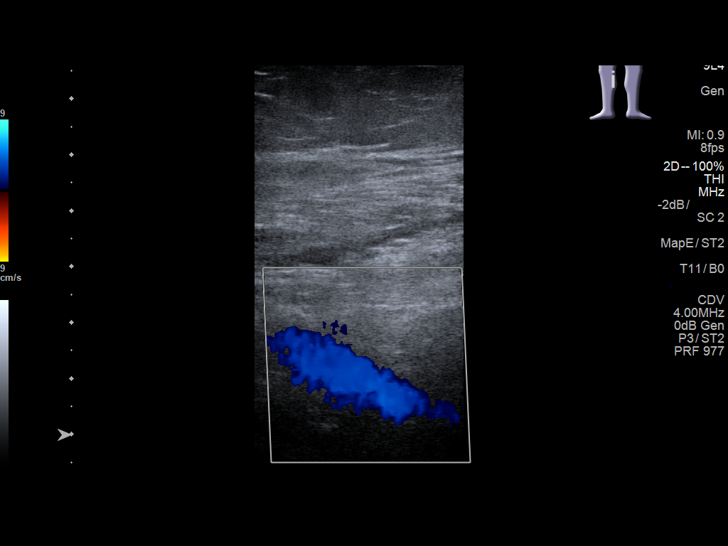
[im 33/40]
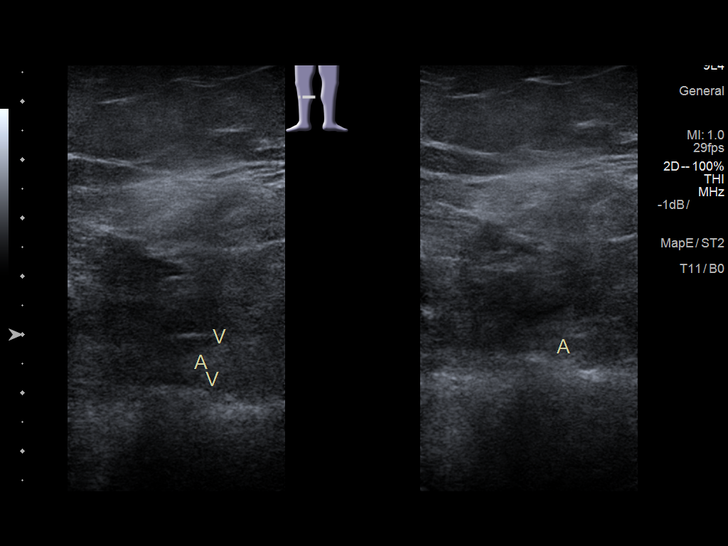
[im 36/40]
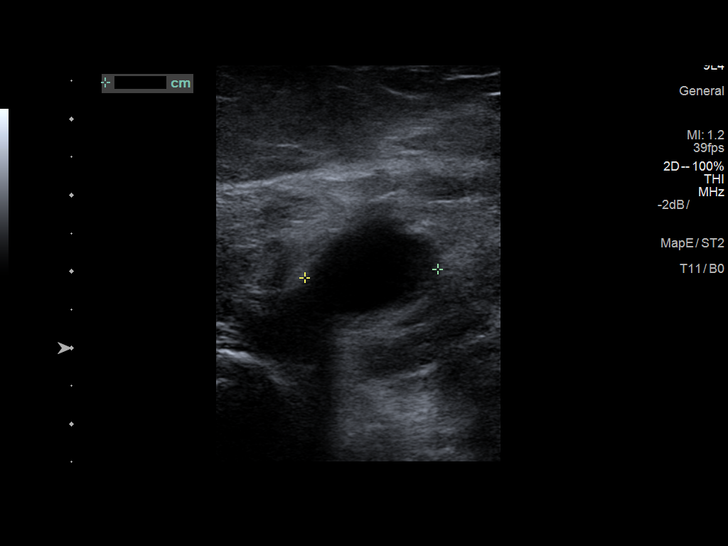
[im 40/40]
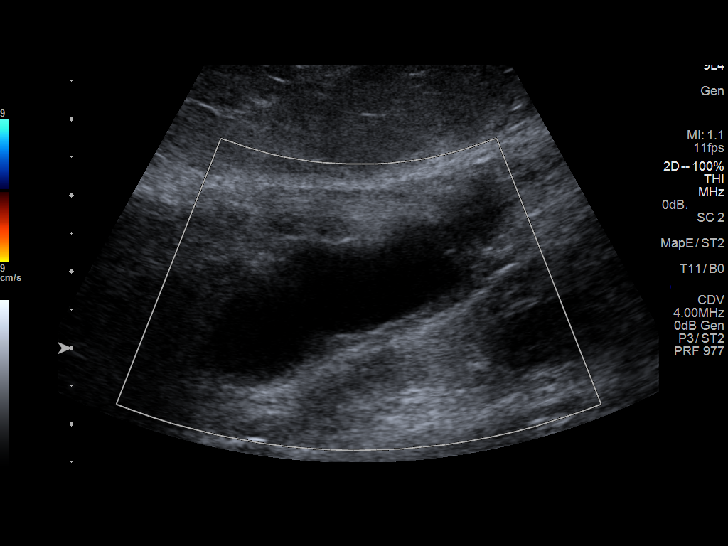

[13 of 24 positions shown; findings below may reference images not displayed]

FINDINGS: Contralateral Common Femoral Vein: Respiratory phasicity is normal
and symmetric with the symptomatic side. No evidence of thrombus.
Normal compressibility.

Common Femoral Vein: No evidence of thrombus. Normal
compressibility, respiratory phasicity and response to augmentation.

Saphenofemoral Junction: No evidence of thrombus. Normal
compressibility and flow on color Doppler imaging.

Profunda Femoral Vein: No evidence of thrombus. Normal
compressibility and flow on color Doppler imaging.

Femoral Vein: No evidence of thrombus. Normal compressibility,
respiratory phasicity and response to augmentation.

Popliteal Vein: No evidence of thrombus. Normal compressibility,
respiratory phasicity and response to augmentation.

Calf Veins: No evidence of thrombus. Normal compressibility and flow
on color Doppler imaging.

Superficial Great Saphenous Vein: No evidence of thrombus. Normal
compressibility.

Venous Reflux:  None.

Other Findings:  4.6 x 1.5 x 1.8 cm popliteal fossa cyst.
IMPRESSION: No evidence of deep venous thrombosis.

4.6 x 1.5 x 1.8 cm right popliteal fossa cyst.

## 2019-12-16 ENCOUNTER — Other Ambulatory Visit: Payer: Self-pay | Admitting: Family Medicine

## 2019-12-16 ENCOUNTER — Ambulatory Visit: Payer: BLUE CROSS/BLUE SHIELD | Attending: Internal Medicine

## 2019-12-16 DIAGNOSIS — Z20822 Contact with and (suspected) exposure to covid-19: Secondary | ICD-10-CM

## 2019-12-16 DIAGNOSIS — Z1231 Encounter for screening mammogram for malignant neoplasm of breast: Secondary | ICD-10-CM

## 2019-12-17 ENCOUNTER — Ambulatory Visit
Admission: RE | Admit: 2019-12-17 | Discharge: 2019-12-17 | Disposition: A | Discharge status: Home / Self Care | Payer: BLUE CROSS/BLUE SHIELD | Source: Ambulatory Visit | Attending: Family Medicine | Admitting: Family Medicine

## 2019-12-17 DIAGNOSIS — Z1231 Encounter for screening mammogram for malignant neoplasm of breast: Secondary | ICD-10-CM | POA: Diagnosis not present

## 2019-12-17 LAB — NOVEL CORONAVIRUS, NAA: SARS-CoV-2, NAA: NOT DETECTED

## 2019-12-25 ENCOUNTER — Ambulatory Visit: Admit: 2019-12-25 | Payer: BLUE CROSS/BLUE SHIELD

## 2019-12-25 SURGERY — PHACOEMULSIFICATION, CATARACT, WITH IOL INSERTION
Anesthesia: Topical | Laterality: Right

## 2020-03-30 ENCOUNTER — Other Ambulatory Visit: Payer: Self-pay | Admitting: Family Medicine

## 2020-03-30 DIAGNOSIS — Z780 Asymptomatic menopausal state: Secondary | ICD-10-CM

## 2020-05-31 ENCOUNTER — Encounter: Payer: Self-pay | Admitting: Ophthalmology

## 2020-05-31 NOTE — Pre-Procedure Instructions (Signed)
PAT Chart review only 

## 2020-06-02 NOTE — Discharge Instructions (Addendum)
Eye Surgery Discharge Instructions  Expect mild scratchy sensation or mild soreness. DO NOT RUB YOUR EYE!  The day of surgery: . Minimal physical activity, but bed rest is not required . No reading, computer work, or close hand work . No bending, lifting, or straining. . May watch TV  For 24 hours: . No driving, legal decisions, or alcoholic beverages . Safety precautions . Eat anything you prefer: It is better to start with liquids, then soup then solid foods. . _____ Eye patch should be worn until postoperative exam tomorrow. . ____ Solar shield eyeglasses should be worn for comfort in the sunlight/patch while sleeping  Resume all regular medications including aspirin or Coumadin if these were discontinued prior to surgery. You may shower, bathe, shave, or wash your hair. Tylenol may be taken for mild discomfort. FOLLOW DR. HARROW'S EYE DROP INSTRUCTION SHEET AS REVIEWED. Call your doctor if you experience significant pain, nausea, or vomiting, fever > 101 or other signs of infection. [phone] or [phone] Specific instructions:   Follow-up Information    Elliot Cousin, MD Follow up.   Specialty: Ophthalmology Why: 06/04/20 @ 8:15 AM Contact information: [address] [place] [address] [phone]

## 2020-06-03 ENCOUNTER — Ambulatory Visit: Payer: Medicare Other | Admitting: Certified Registered Nurse Anesthetist

## 2020-06-03 ENCOUNTER — Encounter
Admission: RE | Disposition: A | Discharge status: Home / Self Care | Payer: Self-pay | Source: Home / Self Care | Attending: Ophthalmology

## 2020-06-03 ENCOUNTER — Ambulatory Visit
Admission: RE | Admit: 2020-06-03 | Discharge: 2020-06-03 | Disposition: A | Discharge status: Home / Self Care | Payer: Medicare Other | Attending: Ophthalmology | Admitting: Ophthalmology

## 2020-06-03 ENCOUNTER — Other Ambulatory Visit: Payer: Self-pay

## 2020-06-03 ENCOUNTER — Encounter: Payer: Self-pay | Admitting: Ophthalmology

## 2020-06-03 DIAGNOSIS — E1140 Type 2 diabetes mellitus with diabetic neuropathy, unspecified: Secondary | ICD-10-CM | POA: Insufficient documentation

## 2020-06-03 DIAGNOSIS — D649 Anemia, unspecified: Secondary | ICD-10-CM | POA: Diagnosis not present

## 2020-06-03 DIAGNOSIS — M7989 Other specified soft tissue disorders: Secondary | ICD-10-CM | POA: Insufficient documentation

## 2020-06-03 DIAGNOSIS — E1136 Type 2 diabetes mellitus with diabetic cataract: Secondary | ICD-10-CM | POA: Diagnosis not present

## 2020-06-03 DIAGNOSIS — E7800 Pure hypercholesterolemia, unspecified: Secondary | ICD-10-CM | POA: Insufficient documentation

## 2020-06-03 DIAGNOSIS — Z6841 Body Mass Index (BMI) 40.0 and over, adult: Secondary | ICD-10-CM | POA: Diagnosis not present

## 2020-06-03 DIAGNOSIS — I2510 Atherosclerotic heart disease of native coronary artery without angina pectoris: Secondary | ICD-10-CM | POA: Diagnosis not present

## 2020-06-03 DIAGNOSIS — M1990 Unspecified osteoarthritis, unspecified site: Secondary | ICD-10-CM | POA: Insufficient documentation

## 2020-06-03 DIAGNOSIS — I10 Essential (primary) hypertension: Secondary | ICD-10-CM | POA: Diagnosis not present

## 2020-06-03 DIAGNOSIS — R0602 Shortness of breath: Secondary | ICD-10-CM | POA: Insufficient documentation

## 2020-06-03 DIAGNOSIS — Z87891 Personal history of nicotine dependence: Secondary | ICD-10-CM | POA: Diagnosis not present

## 2020-06-03 DIAGNOSIS — M109 Gout, unspecified: Secondary | ICD-10-CM | POA: Insufficient documentation

## 2020-06-03 DIAGNOSIS — G4730 Sleep apnea, unspecified: Secondary | ICD-10-CM | POA: Diagnosis not present

## 2020-06-03 DIAGNOSIS — H2511 Age-related nuclear cataract, right eye: Secondary | ICD-10-CM | POA: Diagnosis present

## 2020-06-03 HISTORY — DX: Polyneuropathy, unspecified: G62.9

## 2020-06-03 HISTORY — DX: Gout, unspecified: M10.9

## 2020-06-03 HISTORY — PX: CATARACT EXTRACTION W/PHACO: SHX586

## 2020-06-03 HISTORY — DX: Atherosclerotic heart disease of native coronary artery without angina pectoris: I25.10

## 2020-06-03 LAB — GLUCOSE, CAPILLARY
Glucose-Capillary: 150 mg/dL — ABNORMAL HIGH (ref 70–99)
Glucose-Capillary: 143 mg/dL — ABNORMAL HIGH (ref 70–99)

## 2020-06-03 SURGERY — PHACOEMULSIFICATION, CATARACT, WITH IOL INSERTION
Anesthesia: Monitor Anesthesia Care | Laterality: Right

## 2020-06-03 MED ORDER — EPINEPHRINE PF 1 MG/ML IJ SOLN
INTRAOCULAR | Status: DC | PRN
  Administered 2020-06-03: 200 mL via OPHTHALMIC

## 2020-06-03 MED ORDER — MIDAZOLAM HCL 2 MG/2ML IJ SOLN
INTRAMUSCULAR | Status: AC
  Filled 2020-06-03: qty 2

## 2020-06-03 MED ORDER — POVIDONE-IODINE 5 % OP SOLN
OPHTHALMIC | Status: DC | PRN
  Administered 2020-06-03: 1 via OPHTHALMIC

## 2020-06-03 MED ORDER — NA CHONDROIT SULF-NA HYALURON 40-17 MG/ML IO SOLN
INTRAOCULAR | Status: DC | PRN
  Administered 2020-06-03: 1 mL via INTRAOCULAR

## 2020-06-03 MED ORDER — FENTANYL CITRATE (PF) 100 MCG/2ML IJ SOLN
INTRAMUSCULAR | Status: AC
  Filled 2020-06-03: qty 2

## 2020-06-03 MED ORDER — MIDAZOLAM HCL 2 MG/2ML IJ SOLN
INTRAMUSCULAR | Status: DC | PRN
  Administered 2020-06-03 (×2): 1 mg via INTRAVENOUS

## 2020-06-03 MED ORDER — MOXIFLOXACIN HCL 0.5 % OP SOLN
OPHTHALMIC | Status: AC
  Filled 2020-06-03: qty 3

## 2020-06-03 MED ORDER — CYCLOPENTOLATE-PHENYLEPHRINE 0.2-1 % OP SOLN
1.0000 [drp] | OPHTHALMIC | Status: DC | PRN
  Filled 2020-06-03: qty 2

## 2020-06-03 MED ORDER — TETRACAINE HCL 0.5 % OP SOLN
1.0000 [drp] | Freq: Once | OPHTHALMIC | Status: AC
  Administered 2020-06-03: 1 [drp] via OPHTHALMIC

## 2020-06-03 MED ORDER — FENTANYL CITRATE (PF) 100 MCG/2ML IJ SOLN
INTRAMUSCULAR | Status: DC | PRN
  Administered 2020-06-03 (×2): 12.5 ug via INTRAVENOUS
  Administered 2020-06-03: 25 ug via INTRAVENOUS

## 2020-06-03 MED ORDER — MOXIFLOXACIN HCL 0.5 % OP SOLN: 1.0000 [drp] | Freq: Once | OPHTHALMIC | Status: DC

## 2020-06-03 MED ORDER — ONDANSETRON HCL 4 MG/2ML IJ SOLN
INTRAMUSCULAR | Status: DC | PRN
  Administered 2020-06-03: 4 mg via INTRAVENOUS

## 2020-06-03 MED ORDER — TETRACAINE HCL 0.5 % OP SOLN
OPHTHALMIC | Status: AC
  Administered 2020-06-03: 1 [drp] via OPHTHALMIC
  Filled 2020-06-03: qty 4

## 2020-06-03 MED ORDER — SODIUM CHLORIDE 0.9 % IV SOLN: INTRAVENOUS | Status: DC

## 2020-06-03 MED ORDER — EPINEPHRINE PF 1 MG/ML IJ SOLN
INTRAMUSCULAR | Status: AC
  Filled 2020-06-03: qty 4

## 2020-06-03 MED ORDER — LIDOCAINE HCL (PF) 4 % IJ SOLN
INTRAOCULAR | Status: DC | PRN
  Administered 2020-06-03: 2 mL

## 2020-06-03 MED ORDER — TRYPAN BLUE 0.06 % OP SOLN
OPHTHALMIC | Status: AC
  Filled 2020-06-03: qty 2

## 2020-06-03 MED ORDER — LIDOCAINE HCL (PF) 4 % IJ SOLN
INTRAMUSCULAR | Status: AC
  Filled 2020-06-03: qty 5

## 2020-06-03 MED ORDER — ARMC OPHTHALMIC DILATING DROPS
1.0000 "application " | OPHTHALMIC | Status: AC
  Administered 2020-06-03 (×3): 1 via OPHTHALMIC

## 2020-06-03 MED ORDER — TETRACAINE HCL 0.5 % OP SOLN: 1.0000 [drp] | OPHTHALMIC | Status: DC | PRN

## 2020-06-03 MED ORDER — MOXIFLOXACIN HCL 0.5 % OP SOLN
OPHTHALMIC | Status: DC | PRN
  Administered 2020-06-03: 0.2 mL via OPHTHALMIC

## 2020-06-03 MED ORDER — TRYPAN BLUE 0.06 % OP SOLN
OPHTHALMIC | Status: DC | PRN
  Administered 2020-06-03: 0.5 mL via INTRAOCULAR

## 2020-06-03 MED ORDER — POVIDONE-IODINE 5 % OP SOLN
OPHTHALMIC | Status: AC
  Filled 2020-06-03: qty 120

## 2020-06-03 MED ORDER — SEVOFLURANE IN SOLN
RESPIRATORY_TRACT | Status: AC
  Filled 2020-06-03: qty 250

## 2020-06-03 MED ORDER — ARMC OPHTHALMIC DILATING DROPS
OPHTHALMIC | Status: AC
  Filled 2020-06-03: qty 0.5

## 2020-06-03 MED ORDER — MOXIFLOXACIN HCL 0.5 % OP SOLN: 1.0000 [drp] | OPHTHALMIC | Status: DC

## 2020-06-03 SURGICAL SUPPLY — 18 items
DISSECTOR HYDRO NUCLEUS 50X22 (MISCELLANEOUS) ×12 IMPLANT
DRSG TEGADERM 2-3/8X2-3/4 SM (GAUZE/BANDAGES/DRESSINGS) ×3 IMPLANT
GLOVE BIOGEL M 6.5 STRL (GLOVE) ×3 IMPLANT
GOWN STRL REUS W/ TWL LRG LVL3 (GOWN DISPOSABLE) ×1 IMPLANT
GOWN STRL REUS W/ TWL XL LVL3 (GOWN DISPOSABLE) ×1 IMPLANT
GOWN STRL REUS W/TWL LRG LVL3 (GOWN DISPOSABLE) ×2
GOWN STRL REUS W/TWL XL LVL3 (GOWN DISPOSABLE) ×2
KNIFE 45D UP 2.3 (MISCELLANEOUS) ×3 IMPLANT
LABEL CATARACT MEDS ST (LABEL) ×3 IMPLANT
LENS IOL DIOP 20.5 (Intraocular Lens) ×3 IMPLANT
LENS IOL TECNIS MONO 20.5 (Intraocular Lens) ×1 IMPLANT
PACK CATARACT (MISCELLANEOUS) ×3 IMPLANT
PACK CATARACT KING (MISCELLANEOUS) ×3 IMPLANT
PACK EYE AFTER SURG (MISCELLANEOUS) ×3 IMPLANT
SOL BSS BAG (MISCELLANEOUS) ×3
SOLUTION BSS BAG (MISCELLANEOUS) ×1 IMPLANT
WATER STERILE IRR 250ML POUR (IV SOLUTION) ×3 IMPLANT
WIPE NON LINTING 3.25X3.25 (MISCELLANEOUS) ×3 IMPLANT

## 2020-06-03 NOTE — Transfer of Care (Signed)
Immediate Anesthesia Transfer of Care Note  Patient: Julie Sawyer  Procedure(s) Performed: CATARACT EXTRACTION PHACO AND INTRAOCULAR LENS PLACEMENT (IOC) RIGHT DIABETIC (Right )  Patient Location: PACU and Short Stay  Anesthesia Type:MAC  Level of Consciousness: awake  Airway & Oxygen Therapy: Patient Spontanous Breathing  Post-op Assessment: Report given to RN and Post -op Vital signs reviewed and stable  Post vital signs: Reviewed and stable  Last Vitals:  Vitals Value Taken Time  BP 144/48 06/03/20 0822  Temp 36 C 06/03/20 0822  Pulse 64 06/03/20 0822  Resp 18 06/03/20 0822  SpO2 95 % 06/03/20 0822    Last Pain:  Vitals:   06/03/20 0822  TempSrc: Temporal  PainSc: 0-No pain         Complications: No complications documented.

## 2020-06-03 NOTE — Op Note (Signed)
  PREOPERATIVE DIAGNOSIS:  Nuclear sclerotic cataract of the RIGHT eye.   POSTOPERATIVE DIAGNOSIS:  Nuclear sclerotic cataract of the RIGHT eye.   OPERATIVE PROCEDURE: Cataract surgery OD   SURGEON:  Elliot Cousin, MD.   ANESTHESIA:  Anesthesiologist: Lenard Simmer, MD CRNA: Rosanne Gutting, CRNA Student Nurse Anesthetist: Jeanine Luz, RN  1.      Managed anesthesia care. 2.     0.24ml of Shugarcaine was instilled following the paracentesis   COMPLICATIONS:  None.   TECHNIQUE:   Divide and conquer   DESCRIPTION OF PROCEDURE:  The patient was examined and consented in the preoperative holding area where the aforementioned topical anesthesia was applied to the RIGHT eye and then brought back to the Operating Room where the RIGHT eye was prepped and draped in the usual sterile ophthalmic fashion and a lid speculum was placed. A paracentesis was created with the side port blade, the anterior chamber was washed out with trypan blue to stain the anterior capsule, and the anterior chamber was filled with viscoelastic. A near clear corneal incision was performed with the steel keratome. A continuous curvilinear capsulorrhexis was performed with a cystotome followed by the capsulorrhexis forceps. Hydrodissection and hydrodelineation were carried out with BSS on a blunt cannula. The lens was removed in a divide and conquer  technique and the remaining cortical material was removed with the irrigation-aspiration handpiece. The capsular bag was inflated with viscoelastic and the lens was placed in the capsular bag without complication. The remaining viscoelastic was removed from the eye with the irrigation-aspiration handpiece. The wounds were hydrated. The anterior chamber was flushed and the eye was inflated to physiologic pressure. 0.32ml Vigamox was placed in the anterior chamber. The wounds were found to be water tight. The eye was dressed with Vigamox. The patient was given protective glasses to  wear throughout the day and a shield with which to sleep tonight. The patient was also given drops with which to begin a drop regimen today and will follow-up with me in one day. Implant Name Type Inv. Item Serial No. Manufacturer Lot No. LRB No. Used Action  LENS IOL DIOP 20.5 - [id number] Intraocular Lens LENS IOL DIOP 20.5 [id number] [employer]  Right 1 Implanted  LENS IOL DIOP 20.5 - [id number] Intraocular Lens LENS IOL DIOP 20.5 [id number] [employer]  Right 1 Wasted    Procedure(s) with comments: CATARACT EXTRACTION PHACO AND INTRAOCULAR LENS PLACEMENT (IOC) RIGHT DIABETIC (Right) - Lot # [id number] H US;00:20.0 CDE: 2.69  Electronically signed: Elliot Cousin 06/03/2020 8:30 AM

## 2020-06-03 NOTE — Anesthesia Postprocedure Evaluation (Signed)
Anesthesia Post Note  Patient: Julie Sawyer  Procedure(s) Performed: CATARACT EXTRACTION PHACO AND INTRAOCULAR LENS PLACEMENT (IOC) RIGHT DIABETIC (Right )  Patient location during evaluation: Phase II Anesthesia Type: MAC Level of consciousness: awake and alert Pain management: pain level controlled Vital Signs Assessment: post-procedure vital signs reviewed and stable Respiratory status: spontaneous breathing, nonlabored ventilation and respiratory function stable Cardiovascular status: stable and blood pressure returned to baseline Postop Assessment: no apparent nausea or vomiting Anesthetic complications: no   No complications documented.   Last Vitals:  Vitals:   06/03/20 0648 06/03/20 0822  BP: (!) 138/52 (!) 144/48  Pulse: 67 64  Resp:  18  Temp:  (!) 36 C  SpO2:  95%    Last Pain:  Vitals:   06/03/20 0822  TempSrc: Temporal  PainSc: 0-No pain                 Caryl Asp

## 2020-06-03 NOTE — Anesthesia Preprocedure Evaluation (Signed)
Anesthesia Evaluation  Patient identified by MRN, date of birth, ID band Patient awake    Reviewed: Allergy & Precautions, H&P , NPO status , Patient's Chart, lab work & pertinent test results, reviewed documented beta blocker date and time   History of Anesthesia Complications Negative for: history of anesthetic complications  Airway Mallampati: III  TM Distance: >3 FB Neck ROM: full    Dental  (+) Dental Advidsory Given, Missing, Poor Dentition   Pulmonary shortness of breath and with exertion, sleep apnea and Continuous Positive Airway Pressure Ventilation , neg COPD, neg recent URI, former smoker,    Pulmonary exam normal breath sounds clear to auscultation       Cardiovascular Exercise Tolerance: Good hypertension, (-) angina+ CAD  (-) Past MI and (-) Cardiac Stents Normal cardiovascular exam(-) dysrhythmias (-) Valvular Problems/Murmurs Rhythm:regular Rate:Normal     Neuro/Psych negative neurological ROS  negative psych ROS   GI/Hepatic negative GI ROS, Neg liver ROS,   Endo/Other  diabetesMorbid obesity  Renal/GU negative Renal ROS  negative genitourinary   Musculoskeletal   Abdominal   Peds  Hematology negative hematology ROS (+)   Anesthesia Other Findings Past Medical History: No date: Anemia No date: Arthritis No date: Bursitis No date: Coronary artery disease No date: Diabetes mellitus without complication (HCC) No date: Elevated lipids No date: Gout No date: Hypertension No date: Neuropathy No date: Obesity No date: Shortness of breath dyspnea No date: Sleep apnea     Comment:  use CPAP No date: Vaginal bleeding   Reproductive/Obstetrics negative OB ROS                             Anesthesia Physical Anesthesia Plan  ASA: III  Anesthesia Plan: MAC   Post-op Pain Management:    Induction: Intravenous  PONV Risk Score and Plan: 2 and Midazolam  Airway  Management Planned: Natural Airway  Additional Equipment:   Intra-op Plan:   Post-operative Plan:   Informed Consent: I have reviewed the patients History and Physical, chart, labs and discussed the procedure including the risks, benefits and alternatives for the proposed anesthesia with the patient or authorized representative who has indicated his/her understanding and acceptance.     Dental Advisory Given  Plan Discussed with: Anesthesiologist, CRNA and Surgeon  Anesthesia Plan Comments:         Anesthesia Quick Evaluation

## 2020-06-03 NOTE — Anesthesia Procedure Notes (Signed)
Date/Time: 06/03/2020 7:46 AM Performed by: Rosanne Gutting, CRNA Pre-anesthesia Checklist: Patient identified, Emergency Drugs available, Suction available and Patient being monitored Patient Re-evaluated:Patient Re-evaluated prior to induction Oxygen Delivery Method: Nasal cannula

## 2020-06-03 NOTE — H&P (Signed)
   I have reviewed the patient's H&P and agree with its findings. There have been no interval changes.  Right eye cataract surgery is being performed today.  Elliot Cousin MD Ophthalmology

## 2020-06-03 NOTE — Addendum Note (Signed)
Addendum  created 06/03/20 0839 by Lenard Simmer, MD   Attestation recorded in Intraprocedure, Intraprocedure Attestations filed

## 2020-06-04 ENCOUNTER — Encounter: Payer: Self-pay | Admitting: Ophthalmology

## 2020-07-21 ENCOUNTER — Other Ambulatory Visit: Payer: Self-pay

## 2020-07-21 ENCOUNTER — Other Ambulatory Visit
Admission: RE | Admit: 2020-07-21 | Discharge: 2020-07-21 | Disposition: A | Discharge status: Home / Self Care | Payer: Medicare Other | Source: Ambulatory Visit | Attending: Ophthalmology | Admitting: Ophthalmology

## 2020-07-21 DIAGNOSIS — Z20822 Contact with and (suspected) exposure to covid-19: Secondary | ICD-10-CM | POA: Insufficient documentation

## 2020-07-21 DIAGNOSIS — Z01812 Encounter for preprocedural laboratory examination: Secondary | ICD-10-CM | POA: Diagnosis present

## 2020-07-21 LAB — SARS CORONAVIRUS 2 (TAT 6-24 HRS): SARS Coronavirus 2: NEGATIVE

## 2020-07-23 ENCOUNTER — Encounter
Admission: RE | Disposition: A | Discharge status: Home / Self Care | Payer: Self-pay | Source: Home / Self Care | Attending: Ophthalmology

## 2020-07-23 ENCOUNTER — Encounter: Payer: Self-pay | Admitting: Ophthalmology

## 2020-07-23 ENCOUNTER — Other Ambulatory Visit: Payer: Self-pay

## 2020-07-23 ENCOUNTER — Ambulatory Visit: Payer: Medicare Other | Admitting: Anesthesiology

## 2020-07-23 ENCOUNTER — Ambulatory Visit
Admission: RE | Admit: 2020-07-23 | Discharge: 2020-07-23 | Disposition: A | Discharge status: Home / Self Care | Payer: Medicare Other | Attending: Ophthalmology | Admitting: Ophthalmology

## 2020-07-23 DIAGNOSIS — Z79899 Other long term (current) drug therapy: Secondary | ICD-10-CM | POA: Insufficient documentation

## 2020-07-23 DIAGNOSIS — Z794 Long term (current) use of insulin: Secondary | ICD-10-CM | POA: Insufficient documentation

## 2020-07-23 DIAGNOSIS — I10 Essential (primary) hypertension: Secondary | ICD-10-CM | POA: Insufficient documentation

## 2020-07-23 DIAGNOSIS — G4730 Sleep apnea, unspecified: Secondary | ICD-10-CM | POA: Diagnosis not present

## 2020-07-23 DIAGNOSIS — E1136 Type 2 diabetes mellitus with diabetic cataract: Secondary | ICD-10-CM | POA: Insufficient documentation

## 2020-07-23 DIAGNOSIS — I2510 Atherosclerotic heart disease of native coronary artery without angina pectoris: Secondary | ICD-10-CM | POA: Insufficient documentation

## 2020-07-23 DIAGNOSIS — E7800 Pure hypercholesterolemia, unspecified: Secondary | ICD-10-CM | POA: Diagnosis not present

## 2020-07-23 DIAGNOSIS — H2512 Age-related nuclear cataract, left eye: Secondary | ICD-10-CM | POA: Insufficient documentation

## 2020-07-23 DIAGNOSIS — E1140 Type 2 diabetes mellitus with diabetic neuropathy, unspecified: Secondary | ICD-10-CM | POA: Insufficient documentation

## 2020-07-23 DIAGNOSIS — M1990 Unspecified osteoarthritis, unspecified site: Secondary | ICD-10-CM | POA: Diagnosis not present

## 2020-07-23 HISTORY — PX: CATARACT EXTRACTION W/PHACO: SHX586

## 2020-07-23 LAB — POCT I-STAT, CHEM 8
BUN: 24 mg/dL — ABNORMAL HIGH (ref 8–23)
Calcium, Ion: 1.21 mmol/L (ref 1.15–1.40)
Chloride: 104 mmol/L (ref 98–111)
Creatinine, Ser: 0.8 mg/dL (ref 0.44–1.00)
Glucose, Bld: 151 mg/dL — ABNORMAL HIGH (ref 70–99)
HCT: 32 % — ABNORMAL LOW (ref 36.0–46.0)
Hemoglobin: 10.9 g/dL — ABNORMAL LOW (ref 12.0–15.0)
Potassium: 3.7 mmol/L (ref 3.5–5.1)
Sodium: 144 mmol/L (ref 135–145)
TCO2: 28 mmol/L (ref 22–32)

## 2020-07-23 LAB — GLUCOSE, CAPILLARY: Glucose-Capillary: 131 mg/dL — ABNORMAL HIGH (ref 70–99)

## 2020-07-23 SURGERY — PHACOEMULSIFICATION, CATARACT, WITH IOL INSERTION
Anesthesia: Monitor Anesthesia Care | Site: Eye | Laterality: Left

## 2020-07-23 MED ORDER — TETRACAINE HCL 0.5 % OP SOLN: 1.0000 [drp] | Freq: Once | OPHTHALMIC | Status: AC

## 2020-07-23 MED ORDER — FENTANYL CITRATE (PF) 100 MCG/2ML IJ SOLN
INTRAMUSCULAR | Status: AC
  Filled 2020-07-23: qty 2

## 2020-07-23 MED ORDER — TETRACAINE HCL 0.5 % OP SOLN
OPHTHALMIC | Status: AC
  Administered 2020-07-23: 1 [drp] via OPHTHALMIC
  Filled 2020-07-23: qty 4

## 2020-07-23 MED ORDER — EPINEPHRINE PF 1 MG/ML IJ SOLN: INTRAOCULAR | Status: DC | PRN

## 2020-07-23 MED ORDER — NA CHONDROIT SULF-NA HYALURON 40-17 MG/ML IO SOLN
INTRAOCULAR | Status: DC | PRN
  Administered 2020-07-23: 1 mL via INTRAOCULAR

## 2020-07-23 MED ORDER — MIDAZOLAM HCL 2 MG/2ML IJ SOLN
INTRAMUSCULAR | Status: DC | PRN
  Administered 2020-07-23: 2 mg via INTRAVENOUS

## 2020-07-23 MED ORDER — ONDANSETRON HCL 4 MG/2ML IJ SOLN
INTRAMUSCULAR | Status: DC | PRN
  Administered 2020-07-23: 4 mg via INTRAVENOUS

## 2020-07-23 MED ORDER — MOXIFLOXACIN HCL 0.5 % OP SOLN
OPHTHALMIC | Status: AC
  Filled 2020-07-23: qty 3

## 2020-07-23 MED ORDER — CARBACHOL 0.01 % IO SOLN
INTRAOCULAR | Status: DC | PRN
  Administered 2020-07-23: 0.5 mL via INTRAOCULAR

## 2020-07-23 MED ORDER — MIDAZOLAM HCL 2 MG/2ML IJ SOLN
INTRAMUSCULAR | Status: AC
  Filled 2020-07-23: qty 2

## 2020-07-23 MED ORDER — SODIUM CHLORIDE 0.9 % IV SOLN: INTRAVENOUS | Status: DC

## 2020-07-23 MED ORDER — MOXIFLOXACIN HCL 0.5 % OP SOLN
OPHTHALMIC | Status: DC | PRN
  Administered 2020-07-23: 0.2 mL via OPHTHALMIC

## 2020-07-23 MED ORDER — FENTANYL CITRATE (PF) 100 MCG/2ML IJ SOLN
INTRAMUSCULAR | Status: DC | PRN
Start: 2020-07-23 — End: 2020-07-23
  Administered 2020-07-23 (×3): 25 ug via INTRAVENOUS

## 2020-07-23 MED ORDER — MOXIFLOXACIN HCL 0.5 % OP SOLN: 1.0000 [drp] | Freq: Once | OPHTHALMIC | Status: DC

## 2020-07-23 MED ORDER — ARMC OPHTHALMIC DILATING DROPS
OPHTHALMIC | Status: AC
  Administered 2020-07-23: 1 via OPHTHALMIC
  Filled 2020-07-23: qty 0.5

## 2020-07-23 MED ORDER — PROVISC 10 MG/ML IO SOLN
INTRAOCULAR | Status: DC | PRN
  Administered 2020-07-23: .85 mL via INTRAOCULAR

## 2020-07-23 MED ORDER — LIDOCAINE HCL (PF) 4 % IJ SOLN
INTRAOCULAR | Status: DC | PRN
  Administered 2020-07-23: 4 mL via OPHTHALMIC

## 2020-07-23 MED ORDER — POVIDONE-IODINE 5 % OP SOLN
OPHTHALMIC | Status: DC | PRN
  Administered 2020-07-23: 1 via OPHTHALMIC

## 2020-07-23 MED ORDER — TRYPAN BLUE 0.06 % OP SOLN
OPHTHALMIC | Status: DC | PRN
  Administered 2020-07-23: 0.5 mL via INTRAOCULAR

## 2020-07-23 MED ORDER — ARMC OPHTHALMIC DILATING DROPS
1.0000 "application " | OPHTHALMIC | Status: AC
  Administered 2020-07-23 (×2): 1 via OPHTHALMIC

## 2020-07-23 SURGICAL SUPPLY — 17 items
DISSECTOR HYDRO NUCLEUS 50X22 (MISCELLANEOUS) ×12 IMPLANT
DRSG TEGADERM 2-3/8X2-3/4 SM (GAUZE/BANDAGES/DRESSINGS) ×3 IMPLANT
GLOVE BIOGEL M 6.5 STRL (GLOVE) ×3 IMPLANT
GOWN STRL REUS W/ TWL LRG LVL3 (GOWN DISPOSABLE) ×1 IMPLANT
GOWN STRL REUS W/ TWL XL LVL3 (GOWN DISPOSABLE) ×1 IMPLANT
GOWN STRL REUS W/TWL LRG LVL3 (GOWN DISPOSABLE) ×2
GOWN STRL REUS W/TWL XL LVL3 (GOWN DISPOSABLE) ×2
KNIFE 45D UP 2.3 (MISCELLANEOUS) ×3 IMPLANT
LABEL CATARACT MEDS ST (LABEL) ×3 IMPLANT
LENS IOL TECNIS ITEC 20.5 (Intraocular Lens) ×3 IMPLANT
PACK CATARACT (MISCELLANEOUS) ×3 IMPLANT
PACK CATARACT KING (MISCELLANEOUS) ×3 IMPLANT
PACK EYE AFTER SURG (MISCELLANEOUS) ×3 IMPLANT
SOL BSS BAG (MISCELLANEOUS) ×3
SOLUTION BSS BAG (MISCELLANEOUS) ×1 IMPLANT
WATER STERILE IRR 250ML POUR (IV SOLUTION) ×3 IMPLANT
WIPE NON LINTING 3.25X3.25 (MISCELLANEOUS) ×3 IMPLANT

## 2020-07-23 NOTE — Anesthesia Preprocedure Evaluation (Signed)
Anesthesia Evaluation  Patient identified by MRN, date of birth, ID band Patient awake    Reviewed: Allergy & Precautions, NPO status , Patient's Chart, lab work & pertinent test results  History of Anesthesia Complications Negative for: history of anesthetic complications  Airway Mallampati: III       Dental   Pulmonary sleep apnea and Continuous Positive Airway Pressure Ventilation , neg COPD, former smoker,           Cardiovascular hypertension, Pt. on medications (-) Past MI and (-) CHF (-) dysrhythmias (-) Valvular Problems/Murmurs     Neuro/Psych neg Seizures    GI/Hepatic Neg liver ROS, neg GERD  ,  Endo/Other  diabetes, Type 2, Oral Hypoglycemic AgentsMorbid obesity  Renal/GU negative Renal ROS     Musculoskeletal   Abdominal   Peds  Hematology  (+) anemia ,   Anesthesia Other Findings   Reproductive/Obstetrics                             Anesthesia Physical Anesthesia Plan  ASA: III  Anesthesia Plan: MAC   Post-op Pain Management:    Induction:   PONV Risk Score and Plan:   Airway Management Planned: Nasal Cannula  Additional Equipment:   Intra-op Plan:   Post-operative Plan:   Informed Consent: I have reviewed the patients History and Physical, chart, labs and discussed the procedure including the risks, benefits and alternatives for the proposed anesthesia with the patient or authorized representative who has indicated his/her understanding and acceptance.       Plan Discussed with:   Anesthesia Plan Comments:         Anesthesia Quick Evaluation

## 2020-07-23 NOTE — Transfer of Care (Signed)
Immediate Anesthesia Transfer of Care Note  Patient: Julie Sawyer  Procedure(s) Performed: CATARACT EXTRACTION PHACO AND INTRAOCULAR LENS PLACEMENT (IOC) LEFT VISION BLUE DIABETIC (Left Eye)  Patient Location: PACU  Anesthesia Type:MAC  Level of Consciousness: awake, alert  and oriented  Airway & Oxygen Therapy: Patient Spontanous Breathing  Post-op Assessment: Report given to RN and Post -op Vital signs reviewed and stable  Post vital signs: Reviewed and stable  Last Vitals:  Vitals Value Taken Time  BP 138/40 07/23/20 0935  Temp 36.3 C 07/23/20 0935  Pulse 64 07/23/20 0935  Resp 16 07/23/20 0935  SpO2 97 % 07/23/20 0935    Last Pain:  Vitals:   07/23/20 0935  TempSrc: Temporal  PainSc: 0-No pain         Complications: No complications documented.

## 2020-07-23 NOTE — Progress Notes (Signed)
   07/23/20 0750  Clinical Encounter Type  Visited With Family  Visit Type Initial  Referral From Chaplain  Consult/Referral To Chaplain  While rounding in SDS waiting area, chaplain visited with pt's husband, checking to see how he was doing while waiting. Pt's husband said that he is doing fine.

## 2020-07-23 NOTE — Anesthesia Procedure Notes (Signed)
Date/Time: 07/23/2020 9:25 AM Performed by: Junious Silk, CRNA Pre-anesthesia Checklist: Patient identified, Emergency Drugs available, Suction available, Patient being monitored and Timeout performed Oxygen Delivery Method: Nasal cannula

## 2020-07-23 NOTE — Discharge Instructions (Addendum)
Eye Surgery Discharge Instructions  Expect mild scratchy sensation or mild soreness. DO NOT RUB YOUR EYE!  The day of surgery: . Minimal physical activity, but bed rest is not required . No reading, computer work, or close hand work . No bending, lifting, or straining. . May watch TV  For 24 hours: . No driving, legal decisions, or alcoholic beverages . Safety precautions . Eat anything you prefer: It is better to start with liquids, then soup then solid foods. . Solar shield eyeglasses should be worn for comfort in the sunlight/patch while sleeping  Resume all regular medications including aspirin or Coumadin if these were discontinued prior to surgery. You may shower, bathe, shave, or wash your hair. Tylenol may be taken for mild discomfort. FOLLOW DR. HARROW'S EYE DROP INSTRUCTION SHEET AS REVIEWED.  Call your doctor if you experience significant pain, nausea, or vomiting, fever > 101 or other signs of infection. [phone] or [phone] Specific instructions:   Follow-up Information    Elliot Cousin, MD Follow up.   Specialty: Ophthalmology Why: Today 07-23-20 @ 1:50 pm [facility] information: [address] [place] [address] [phone]

## 2020-07-23 NOTE — H&P (Signed)
   I have reviewed the patient's H&P and agree with its findings. There have been no interval changes.    MD Ophthalmology 

## 2020-07-23 NOTE — Anesthesia Postprocedure Evaluation (Signed)
Anesthesia Post Note  Patient: Julie Sawyer  Procedure(s) Performed: CATARACT EXTRACTION PHACO AND INTRAOCULAR LENS PLACEMENT (IOC) LEFT VISION BLUE DIABETIC (Left Eye)  Patient location during evaluation: Phase II Anesthesia Type: MAC Level of consciousness: awake, awake and alert, oriented and patient cooperative Pain management: pain level controlled Vital Signs Assessment: post-procedure vital signs reviewed and stable Respiratory status: spontaneous breathing, nonlabored ventilation and respiratory function stable Cardiovascular status: stable Postop Assessment: no headache Anesthetic complications: no   No complications documented.   Last Vitals:  Vitals:   07/23/20 0708 07/23/20 0935  BP: (!) 136/45 (!) 138/40  Pulse: 71 64  Resp: 18 16  Temp: (!) 36.1 C (!) 36.3 C  SpO2: 94% 97%    Last Pain:  Vitals:   07/23/20 0935  TempSrc: Temporal  PainSc: 0-No pain                 ,   R

## 2020-07-23 NOTE — Op Note (Signed)
  PREOPERATIVE DIAGNOSIS:  Nuclear sclerotic cataract of the LEFT eye.   POSTOPERATIVE DIAGNOSIS:  Nuclear sclerotic cataract of the LEFT eye.   OPERATIVE PROCEDURE: Cataract surgery OS   SURGEON:  Elliot Cousin, MD.   ANESTHESIA:  Anesthesiologist: Naomie Dean, MD CRNA: Junious Silk, CRNA  1.      Managed anesthesia care. 2.     0.26ml of Shugarcaine was instilled following the paracentesis   COMPLICATIONS:  None.   TECHNIQUE:   Divide and conquer   DESCRIPTION OF PROCEDURE:  The patient was examined and consented in the preoperative holding area where the aforementioned topical anesthesia was applied to the LEFT eye and then brought back to the Operating Room where the left eye was prepped and draped in the usual sterile ophthalmic fashion and a lid speculum was placed. A paracentesis was created with the side port blade, the anterior chamber was washed out with trypan blue to stain the anterior capsule, and the anterior chamber was filled with viscoelastic. A near clear corneal incision was performed with the steel keratome. A continuous curvilinear capsulorrhexis was performed with a cystotome followed by the capsulorrhexis forceps. Hydrodissection and hydrodelineation were carried out with BSS on a blunt cannula. The lens was removed in a divide and conquer  technique and the remaining cortical material was removed with the irrigation-aspiration handpiece. The capsular bag was inflated with viscoelastic and the lens was placed in the capsular bag without complication. The remaining viscoelastic was removed from the eye with the irrigation-aspiration handpiece. The wounds were hydrated. The anterior chamber was flushed and the eye was inflated to physiologic pressure. 0.62ml Vigamox was placed in the anterior chamber. The wounds were found to be water tight. The eye was dressed with Vigamox. The patient was given protective glasses to wear throughout the day and a shield with which to  sleep tonight. The patient was also given drops with which to begin a drop regimen today and will follow-up with me in one day. Implant Name Type Inv. Item Serial No. Manufacturer Lot No. LRB No. Used Action  LENS IOL DIOP 20.5 - [id number] Intraocular Lens LENS IOL DIOP 20.5 [id number] JOHNSON   Left 1 Implanted    Procedure(s) with comments: CATARACT EXTRACTION PHACO AND INTRAOCULAR LENS PLACEMENT (IOC) LEFT VISION BLUE DIABETIC (Left) - [place] 00:17.5 CDE 2.97 AP% 8.2 Fluid Pack Lot # [id number]  Electronically signed: Elliot Cousin 07/23/2020 9:39 AM

## 2020-09-27 ENCOUNTER — Other Ambulatory Visit: Payer: Self-pay | Admitting: Family Medicine

## 2020-09-27 ENCOUNTER — Ambulatory Visit
Admission: RE | Admit: 2020-09-27 | Discharge: 2020-09-27 | Disposition: A | Discharge status: Home / Self Care | Payer: Medicare Other | Source: Ambulatory Visit | Attending: Family Medicine | Admitting: Family Medicine

## 2020-09-27 ENCOUNTER — Ambulatory Visit
Admission: RE | Admit: 2020-09-27 | Discharge: 2020-09-27 | Disposition: A | Discharge status: Home / Self Care | Payer: Medicare Other | Attending: Family Medicine | Admitting: Family Medicine

## 2020-09-27 DIAGNOSIS — M546 Pain in thoracic spine: Secondary | ICD-10-CM | POA: Diagnosis not present

## 2020-11-09 ENCOUNTER — Other Ambulatory Visit: Payer: Self-pay | Admitting: Family Medicine

## 2020-11-09 DIAGNOSIS — Z1231 Encounter for screening mammogram for malignant neoplasm of breast: Secondary | ICD-10-CM

## 2020-12-17 ENCOUNTER — Other Ambulatory Visit: Payer: Self-pay

## 2020-12-17 ENCOUNTER — Ambulatory Visit
Admission: RE | Admit: 2020-12-17 | Discharge: 2020-12-17 | Disposition: A | Discharge status: Home / Self Care | Payer: Medicare Other | Source: Ambulatory Visit | Attending: Family Medicine | Admitting: Family Medicine

## 2020-12-17 DIAGNOSIS — Z1231 Encounter for screening mammogram for malignant neoplasm of breast: Secondary | ICD-10-CM | POA: Diagnosis not present

## 2021-01-03 ENCOUNTER — Ambulatory Visit (INDEPENDENT_AMBULATORY_CARE_PROVIDER_SITE_OTHER): Payer: Medicare Other | Admitting: Internal Medicine

## 2021-01-03 VITALS — BP 196/78 | HR 92 | Temp 99.7°F | Resp 16 | Ht 60.0 in | Wt 303.0 lb

## 2021-01-03 DIAGNOSIS — G4733 Obstructive sleep apnea (adult) (pediatric): Secondary | ICD-10-CM | POA: Insufficient documentation

## 2021-01-03 DIAGNOSIS — I10 Essential (primary) hypertension: Secondary | ICD-10-CM | POA: Diagnosis not present

## 2021-01-03 DIAGNOSIS — Z7189 Other specified counseling: Secondary | ICD-10-CM

## 2021-01-03 DIAGNOSIS — Z9989 Dependence on other enabling machines and devices: Secondary | ICD-10-CM

## 2021-01-03 NOTE — Patient Instructions (Signed)

## 2021-01-03 NOTE — Progress Notes (Signed)
[facility] [address] [place], [place] [address]  Pulmonary Sleep Medicine   Office Visit Note  Patient Name: Julie Sawyer DOB: Aug 27, 1956 MRN [id number]    Chief Complaint: Obstructive Sleep Apnea visit  Brief History:  Julie Sawyer is seen today for follow up The patient has a 7 year history of sleep apnea. Patient is using PAP nightly.  The patient feels more rested after sleeping with PAP.  The patient reports benefiting from PAP use. Reported sleepiness is  improved and the Epworth Sleepiness Score is 10 out of 24. The patient does not take naps. The patient complains of the following: occasional dryness  The compliance download shows  Excellent compliance with an average use time of 8.9 hours. The AHI is 0.4  The patient does not complain of limb movements disrupting sleep.  ROS  General: (-) fever, (-) chills, (-) night sweat Nose and Sinuses: (-) nasal stuffiness or itchiness, (-) postnasal drip, (-) nosebleeds, (-) sinus trouble. Mouth and Throat: (-) sore throat, (-) hoarseness. Neck: (-) swollen glands, (-) enlarged thyroid, (-) neck pain. Respiratory: - cough, - shortness of breath, - wheezing. Neurologic: - numbness, - tingling. Psychiatric: - anxiety, - depression   Current Medication: Outpatient Encounter Medications as of 01/03/2021  Medication Sig Note  . Ascorbic Acid (SM CHEWABLE VITAMIN C) 500 MG CHEW Chew 500 mg by mouth daily.   Marland Kitchen aspirin EC 81 MG tablet Take 81 mg by mouth daily. Swallow whole.   . Biotin w/ Vitamins C & E (HAIR/SKIN/NAILS PO) Take 2 tablets by mouth daily. Gummy   . Black Elderberry (SAMBUCUS ELDERBERRY PO) Take 1,250 mg by mouth daily.   . cloNIDine (CATAPRES) 0.1 MG tablet Take 0.1 mg by mouth 2 (two) times daily.   . furosemide (LASIX) 20 MG tablet Take 20 mg by mouth 2 (two) times daily.   Marland Kitchen gabapentin (NEURONTIN) 300 MG capsule Take 300 mg by mouth 2 (two) times daily.    . hydrALAZINE (APRESOLINE) 100 MG tablet Take  100-200 mg by mouth See admin instructions. Take 2 tablets (200 mg) by mouth in the morning & take 1 tablet (100 mg) by mouth in the afternoon.   . indomethacin (INDOCIN) 50 MG capsule Take 50 mg by mouth 2 (two) times daily as needed for moderate pain (pain (gout)).    Marland Kitchen insulin detemir (LEVEMIR) 100 UNIT/ML injection Inject 60 Units into the skin 2 (two) times daily.  07/23/2020: 30 units  . LEVEMIR FLEXTOUCH 100 UNIT/ML FlexPen    . liraglutide (VICTOZA) 18 MG/3ML SOPN Inject 1.2 mg into the skin daily.   Marland Kitchen losartan (COZAAR) 100 MG tablet Take 100 mg by mouth daily.   . meloxicam (MOBIC) 15 MG tablet Take 15 mg by mouth daily.   . metoprolol succinate (TOPROL-XL) 50 MG 24 hr tablet Take 50 mg by mouth daily.   . rosuvastatin (CRESTOR) 20 MG tablet Take 20 mg by mouth at bedtime.    Marland Kitchen SLOW FE 142 (45 Fe) MG TBCR Take 1 tablet by mouth daily. 07/15/2020: New medication patient has not started   No facility-administered encounter medications on file as of 01/03/2021.    Surgical History: Past Surgical History:  Procedure Laterality Date  . BREAST BIOPSY Left    neg  . BREAST SURGERY Left   . CATARACT EXTRACTION W/PHACO Right 06/03/2020   Procedure: CATARACT EXTRACTION PHACO AND INTRAOCULAR LENS PLACEMENT (IOC) RIGHT DIABETIC;  Surgeon: Elliot Cousin, MD;  Location: [facility] ORS;  Service: Ophthalmology;  Laterality: Right;  Lot # [id number] H US;00:20.0 CDE: 2.69  . CATARACT EXTRACTION W/PHACO Left 07/23/2020   Procedure: CATARACT EXTRACTION PHACO AND INTRAOCULAR LENS PLACEMENT (IOC) LEFT VISION BLUE DIABETIC;  Surgeon: Elliot Cousin, MD;  Location: [facility] ORS;  Service: Ophthalmology;  Laterality: Left;  [place] 00:17.5 CDE 2.97 AP% 8.2 Fluid Pack Lot # [id number]  . CESAREAN SECTION  1989  . DILATATION & CURETTAGE/HYSTEROSCOPY WITH MYOSURE N/A 08/28/2017   Procedure: DILATATION & CURETTAGE/HYSTEROSCOPY WITH MYOSURE;  Surgeon: Conard Novak, MD;  Location: [facility] ORS;  Service: Gynecology;  Laterality:  N/A;  . DILATION AND CURETTAGE OF UTERUS    . ENDOMETRIAL ABLATION N/A 08/28/2017   Procedure: ENDOMETRIAL POLYPECTOMY;  Surgeon: Conard Novak, MD;  Location: [facility] ORS;  Service: Gynecology;  Laterality: N/A;  . TONSILLECTOMY    . TUBAL LIGATION      Medical History: Past Medical History:  Diagnosis Date  . Anemia   . Arthritis   . Bursitis   . Coronary artery disease   . Diabetes mellitus without complication (HCC)   . Elevated lipids   . Gout   . Hypertension   . Neuropathy   . Obesity   . Shortness of breath dyspnea   . Sleep apnea    use CPAP  . Vaginal bleeding     Family History: Non contributory to the present illness  Social History: Social History   Socioeconomic History  . Marital status: Married    Spouse name: Not on file  . Number of children: Not on file  . Years of education: Not on file  . Highest education level: Not on file  Occupational History  . Not on file  Tobacco Use  . Smoking status: Former Smoker    Packs/day: 0.75    Years: 20.00    Pack years: 15.00    Quit date: 11/13/1997    Years since quitting: 23.1  . Smokeless tobacco: Never Used  Vaping Use  . Vaping Use: Never used  Substance and Sexual Activity  . Alcohol use: No  . Drug use: No  . Sexual activity: Not on file  Other Topics Concern  . Not on file  Social History Narrative  . Not on file   Social Determinants of Health   Financial Resource Strain: Not on file  Food Insecurity: Not on file  Transportation Needs: Not on file  Physical Activity: Not on file  Stress: Not on file  Social Connections: Not on file  Intimate Partner Violence: Not on file    Vital Signs: Blood pressure (!) 196/78, pulse 92, temperature 99.7 F (37.6 C), temperature source Temporal, resp. rate 16, height 5' (1.524 m), weight (!) 303 lb (137.4 kg), SpO2 95 %.  Examination: General Appearance: The patient is well-developed, well-nourished, and in no distress. Neck Circumference:  43 Skin: Gross inspection of skin unremarkable. Head: normocephalic, no gross deformities. Eyes: no gross deformities noted. ENT: ears appear grossly normal Neurologic: Alert and oriented. No involuntary movements.    EPWORTH SLEEPINESS SCALE:  Scale:  (0)= no chance of dozing; (1)= slight chance of dozing; (2)= moderate chance of dozing; (3)= high chance of dozing  Chance  Situtation    Sitting and reading: 2    Watching TV: 1    Sitting Inactive in public: 1    As a passenger in car: 2      Lying down to rest: 1    Sitting and talking: 1    Sitting quielty after lunch: 2  In a car, stopped in traffic: 0   TOTAL SCORE:   10 out of 24    SLEEP STUDIES:  1. 01/12/14  Split - AHI 24.4,  right lateral 45.8, low SpO2 84%, 12cmH2O   CPAP COMPLIANCE DATA:  Date Range: 12/31/19 - 12/29/20  Average Daily Use: 9:01 hours  Median Use: 8:55  Compliance for > 4 Hours: 100 days  AHI: 0.4 respiratory events per hour  Days Used: 365/365  Mask Leak: 26.9  95th Percentile Pressure: 12 cmh2o         LABS: No results found for this or any previous visit (from the past 2160 hour(s)).  Radiology: MM 3D SCREEN BREAST BILATERAL  Result Date: 12/17/2020 CLINICAL DATA:  Screening. EXAM: DIGITAL SCREENING BILATERAL MAMMOGRAM WITH TOMOSYNTHESIS AND CAD COMPARISON:  Previous exam(s). ACR Breast Density Category c: The breast tissue is heterogeneously dense, which may obscure small masses. FINDINGS: There are no findings suspicious for malignancy. The images were evaluated with computer-aided detection. IMPRESSION: No mammographic evidence of malignancy. A result letter of this screening mammogram will be mailed directly to the patient. RECOMMENDATION: Screening mammogram in one year. (Code:SM-B-01Y) BI-RADS CATEGORY  1: Negative. Electronically Signed   By: Sande BrothersSerena  Chacko M.D.   On: 12/17/2020 16:25    No results found.  MM 3D SCREEN BREAST BILATERAL  Result Date:  12/17/2020 CLINICAL DATA:  Screening. EXAM: DIGITAL SCREENING BILATERAL MAMMOGRAM WITH TOMOSYNTHESIS AND CAD COMPARISON:  Previous exam(s). ACR Breast Density Category c: The breast tissue is heterogeneously dense, which may obscure small masses. FINDINGS: There are no findings suspicious for malignancy. The images were evaluated with computer-aided detection. IMPRESSION: No mammographic evidence of malignancy. A result letter of this screening mammogram will be mailed directly to the patient. RECOMMENDATION: Screening mammogram in one year. (Code:SM-B-01Y) BI-RADS CATEGORY  1: Negative. Electronically Signed   By: Sande BrothersSerena  Chacko M.D.   On: 12/17/2020 16:25      Assessment and Plan: Patient Active Problem List   Diagnosis Date Noted  . CPAP use counseling 01/03/2021  . Morbid obesity (HCC) 01/03/2021  . Hypertension 01/03/2021  . OSA on CPAP 01/03/2021  . Postmenopausal bleeding 04/17/2017  . Simple endometrial hyperplasia without atypia 04/17/2017  . Endometrial polyp 04/17/2017   1. CPAP use counseling CPAP Counseling: had a lengthy discussion with the patient regarding the importance of PAP therapy in management of the sleep apnea. Patient appears to understand the risk factor reduction and also understands the risks associated with untreated sleep apnea. Patient will try to make a good faith effort to remain compliant with therapy. Also instructed the patient on proper cleaning of the device including the water must be changed daily if possible and use of distilled water is preferred. Patient understands that the machine should be regularly cleaned with appropriate recommended cleaning solutions that do not damage the PAP machine for example given white vinegar and water rinses. Other methods such as ozone treatment may not be as good as these simple methods to achieve cleaning.  2. OSA on CPAP The patient does  tolerate PAP and reports significant benefit from PAP use. The patient was  reminded how to clean the CPAP  The patient was also counselled on the importance of regular exercise. The compliance has been excellent and the apnea is controlled. The patient's machine is past end of life and must be replaced.  2. OSA- continue excellent compliance. Follow up 30+ days after set up     3. Hypertension, unspecified type Today's  blood pressure was discussed. It came down some in the office from initial reading. She has a monitor at home and will go home and recheck. She has no symptoms. She believes it was elevated due to being upset. Hypertension Counseling:   The following hypertensive lifestyle modification were recommended and discussed:  1. Limiting alcohol intake to less than 1 oz/day of ethanol:(24 oz of beer or 8 oz of wine or 2 oz of 100-proof whiskey). 2. Take baby ASA 81 mg daily. 3. Importance of regular aerobic exercise and losing weight. 4. Reduce dietary saturated fat and cholesterol intake for overall cardiovascular health. 5. Maintaining adequate dietary potassium, calcium, and magnesium intake. 6. Regular monitoring of the blood pressure. 7. Reduce sodium intake to less than 100 mmol/day (less than 2.3 gm of sodium or less than 6 gm of sodium choride)   4. Morbid obesity (HCC) Obesity Counseling: Had a lengthy discussion regarding patients BMI and weight issues. Patient was instructed on portion control as well as increased activity. Also discussed caloric restrictions with trying to maintain intake less than 2000 Kcal. Discussions were made in accordance with the 5As of weight management. Simple actions such as not eating late and if able to, taking a walk is suggested.   OSA- continue excellent compliance General Counseling: I have discussed the findings of the evaluation and examination with Julie Sawyer.  I have also discussed any further diagnostic evaluation thatmay be needed or ordered today. Julie Sawyer verbalizes understanding of the findings of todays visit. We  also reviewed her medications today and discussed drug interactions and side effects including but not limited excessive drowsiness and altered mental states. We also discussed that there is always a risk not just to her but also people around her. she has been encouraged to call the office with any questions or concerns that should arise related to todays visit.  No orders of the defined types were placed in this encounter.       I have personally obtained a history, examined the patient, evaluated laboratory and imaging results, formulated the assessment and plan and placed orders. This patient was seen today by Emmaline Kluver, PA-C in collaboration with Dr. Freda Munro.   Valentino Hue Sol Blazing, PhD, FAASM  Diplomate, American Board of Sleep Medicine    Yevonne Pax, MD [facility] Diplomate ABMS Pulmonary and Critical Care Medicine Sleep medicine

## 2021-03-07 ENCOUNTER — Ambulatory Visit (INDEPENDENT_AMBULATORY_CARE_PROVIDER_SITE_OTHER): Payer: Medicare Other | Admitting: Internal Medicine

## 2021-03-07 VITALS — BP 138/62 | HR 86 | Temp 98.8°F | Resp 16 | Ht 60.0 in | Wt 303.0 lb

## 2021-03-07 DIAGNOSIS — I10 Essential (primary) hypertension: Secondary | ICD-10-CM

## 2021-03-07 DIAGNOSIS — G4733 Obstructive sleep apnea (adult) (pediatric): Secondary | ICD-10-CM

## 2021-03-07 DIAGNOSIS — Z7189 Other specified counseling: Secondary | ICD-10-CM | POA: Diagnosis not present

## 2021-03-07 DIAGNOSIS — Z9989 Dependence on other enabling machines and devices: Secondary | ICD-10-CM

## 2021-03-07 NOTE — Progress Notes (Signed)
[facility] [address] [place], [place] [address]  Pulmonary Sleep Medicine   Office Visit Note  Patient Name: Julie Sawyer DOB: 05-28-1956 MRN [id number]    Chief Complaint: Obstructive Sleep Apnea visit  Brief History:  [name] is seen today for follow up of sleep apnea. Her cpap machine was recently replaced.  The patient has a 7 year history of sleep apnea. Patient is using PAP nightly.  The patient feels better after sleeping with PAP.  The patient reports benefitting from PAP use. Reported sleepiness is  minimal and the Epworth Sleepiness Score is 4 out of 24. The patient does not take naps. The patient complains of the following: foot and ankle swelling, recently set up for stress testing.   The compliance download shows  compliance with an average use time of 7:50 hours. The AHI is 0.3  The patient does not complain of limb movements disrupting sleep.  ROS  General: (-) fever, (-) chills, (-) night sweat Nose and Sinuses: (-) nasal stuffiness or itchiness, (-) postnasal drip, (-) nosebleeds, (-) sinus trouble. Mouth and Throat: (-) sore throat, (-) hoarseness. Neck: (-) swollen glands, (-) enlarged thyroid, (-) neck pain. Respiratory: + cough, - shortness of breath, - wheezing. Neurologic: - numbness, - tingling. Psychiatric: - anxiety, - depression   Current Medication: Outpatient Encounter Medications as of 03/07/2021  Medication Sig Note  . Ascorbic Acid (SM CHEWABLE VITAMIN C) 500 MG CHEW Chew 500 mg by mouth daily.   Marland Kitchen aspirin EC 81 MG tablet Take 81 mg by mouth daily. Swallow whole.   . Biotin w/ Vitamins C & E (HAIR/SKIN/NAILS PO) Take 2 tablets by mouth daily. Gummy   . Black Elderberry (SAMBUCUS ELDERBERRY PO) Take 1,250 mg by mouth daily.   . carvedilol (COREG) 6.25 MG tablet Take 6.25 mg by mouth 2 (two) times daily.   . cloNIDine (CATAPRES) 0.1 MG tablet Take 0.1 mg by mouth 2 (two) times daily.   . furosemide (LASIX) 20 MG tablet Take 20 mg  by mouth 2 (two) times daily.   Marland Kitchen gabapentin (NEURONTIN) 300 MG capsule Take 300 mg by mouth 2 (two) times daily.    . hydrALAZINE (APRESOLINE) 100 MG tablet Take 100-200 mg by mouth See admin instructions. Take 2 tablets (200 mg) by mouth in the morning & take 1 tablet (100 mg) by mouth in the afternoon.   . indomethacin (INDOCIN) 50 MG capsule Take 50 mg by mouth 2 (two) times daily as needed for moderate pain (pain (gout)).    Marland Kitchen insulin detemir (LEVEMIR) 100 UNIT/ML injection Inject 60 Units into the skin 2 (two) times daily.  07/23/2020: 30 units  . LEVEMIR FLEXTOUCH 100 UNIT/ML FlexPen    . liraglutide (VICTOZA) 18 MG/3ML SOPN Inject 1.2 mg into the skin daily.   Marland Kitchen losartan (COZAAR) 100 MG tablet Take 100 mg by mouth daily.   . meloxicam (MOBIC) 15 MG tablet Take 15 mg by mouth daily.   . rosuvastatin (CRESTOR) 20 MG tablet Take 20 mg by mouth at bedtime.    Marland Kitchen SLOW FE 142 (45 Fe) MG TBCR Take 1 tablet by mouth daily. 07/15/2020: New medication patient has not started  . [DISCONTINUED] metoprolol succinate (TOPROL-XL) 50 MG 24 hr tablet Take 50 mg by mouth daily.    No facility-administered encounter medications on file as of 03/07/2021.    Surgical History: Past Surgical History:  Procedure Laterality Date  . BREAST BIOPSY Left    neg  . BREAST  SURGERY Left   . CATARACT EXTRACTION W/PHACO Right 06/03/2020   Procedure: CATARACT EXTRACTION PHACO AND INTRAOCULAR LENS PLACEMENT (IOC) RIGHT DIABETIC;  Surgeon: Elliot Cousin, MD;  Location: [facility] ORS;  Service: Ophthalmology;  Laterality: Right;  Lot # [id number] H US;00:20.0 CDE: 2.69  . CATARACT EXTRACTION W/PHACO Left 07/23/2020   Procedure: CATARACT EXTRACTION PHACO AND INTRAOCULAR LENS PLACEMENT (IOC) LEFT VISION BLUE DIABETIC;  Surgeon: Elliot Cousin, MD;  Location: [facility] ORS;  Service: Ophthalmology;  Laterality: Left;  [place] 00:17.5 CDE 2.97 AP% 8.2 Fluid Pack Lot # [id number]  . CESAREAN SECTION  1989  . DILATATION & CURETTAGE/HYSTEROSCOPY  WITH MYOSURE N/A 08/28/2017   Procedure: DILATATION & CURETTAGE/HYSTEROSCOPY WITH MYOSURE;  Surgeon: Conard Novak, MD;  Location: [facility] ORS;  Service: Gynecology;  Laterality: N/A;  . DILATION AND CURETTAGE OF UTERUS    . ENDOMETRIAL ABLATION N/A 08/28/2017   Procedure: ENDOMETRIAL POLYPECTOMY;  Surgeon: Conard Novak, MD;  Location: [facility] ORS;  Service: Gynecology;  Laterality: N/A;  . TONSILLECTOMY    . TUBAL LIGATION      Medical History: Past Medical History:  Diagnosis Date  . Anemia   . Arthritis   . Bursitis   . Coronary artery disease   . Diabetes mellitus without complication (HCC)   . Elevated lipids   . Gout   . Hypertension   . Neuropathy   . Obesity   . Shortness of breath dyspnea   . Sleep apnea    use CPAP  . Vaginal bleeding     Family History: Non contributory to the present illness  Social History: Social History   Socioeconomic History  . Marital status: Married    Spouse name: Not on file  . Number of children: Not on file  . Years of education: Not on file  . Highest education level: Not on file  Occupational History  . Not on file  Tobacco Use  . Smoking status: Former Smoker    Packs/day: 0.75    Years: 20.00    Pack years: 15.00    Quit date: 11/13/1997    Years since quitting: 23.3  . Smokeless tobacco: Never Used  Vaping Use  . Vaping Use: Never used  Substance and Sexual Activity  . Alcohol use: No  . Drug use: No  . Sexual activity: Not on file  Other Topics Concern  . Not on file  Social History Narrative  . Not on file   Social Determinants of Health   Financial Resource Strain: Not on file  Food Insecurity: Not on file  Transportation Needs: Not on file  Physical Activity: Not on file  Stress: Not on file  Social Connections: Not on file  Intimate Partner Violence: Not on file    Vital Signs: Blood pressure 138/62, pulse 86, temperature 98.8 F (37.1 C), temperature source Temporal, resp. rate 16, height  5' (1.524 m), weight (!) 303 lb (137.4 kg), SpO2 96 %.  Examination: General Appearance: The patient is well-developed, well-nourished, and in no distress. Neck Circumference: 43 Skin: Gross inspection of skin unremarkable. Head: normocephalic, no gross deformities. Eyes: no gross deformities noted. ENT: ears appear grossly normal Neurologic: Alert and oriented. No involuntary movements.    EPWORTH SLEEPINESS SCALE:  Scale:  (0)= no chance of dozing; (1)= slight chance of dozing; (2)= moderate chance of dozing; (3)= high chance of dozing  Chance  Situtation    Sitting and reading: 2    Watching TV: 1    Sitting Inactive in public:  0    As a passenger in car: 0      Lying down to rest: 0    Sitting and talking: 0    Sitting quielty after lunch: 1    In a car, stopped in traffic: 0   TOTAL SCORE:   4 out of 24    SLEEP STUDIES:  1. 01/12/14  Split - AHI 24.4,  right lateral 45.8, low SpO2 84%, 12cmH2O    CPAP COMPLIANCE DATA:  Date Range: 02/02/21 - 03/03/21  Average Daily Use: 7:50 hours  Median Use: 8:37  Compliance for > 4 Hours: 90% days  AHI: 0.3 respiratory events per hour  Days Used: 27/30 days  Mask Leak: 49.7 lpm  95th Percentile Pressure: 12 cmH2O         LABS: No results found for this or any previous visit (from the past 2160 hour(s)).  Radiology: MM 3D SCREEN BREAST BILATERAL  Result Date: 12/17/2020 CLINICAL DATA:  Screening. EXAM: DIGITAL SCREENING BILATERAL MAMMOGRAM WITH TOMOSYNTHESIS AND CAD COMPARISON:  Previous exam(s). ACR Breast Density Category c: The breast tissue is heterogeneously dense, which may obscure small masses. FINDINGS: There are no findings suspicious for malignancy. The images were evaluated with computer-aided detection. IMPRESSION: No mammographic evidence of malignancy. A result letter of this screening mammogram will be mailed directly to the patient. RECOMMENDATION: Screening mammogram in one year.  (Code:SM-B-01Y) BI-RADS CATEGORY  1: Negative. Electronically Signed   By: Sande Brothers M.D.   On: 12/17/2020 16:25    No results found.  No results found.    Assessment and Plan: Patient Active Problem List   Diagnosis Date Noted  . CPAP use counseling 01/03/2021  . Morbid obesity (HCC) 01/03/2021  . Hypertension 01/03/2021  . OSA on CPAP 01/03/2021  . Postmenopausal bleeding 04/17/2017  . Simple endometrial hyperplasia without atypia 04/17/2017  . Endometrial polyp 04/17/2017    1. OSA on CPAP The patient does tolerate PAP and reports definite benefit from PAP use. The patient was reminded how to clean her equipment and advised to replace supplies regularly. The patient was also counselled on weight loss. . The compliance is excellent. The AHI is 0.3. OSA- continue cpap at 12 cm h20.    2. CPAP use counseling CPAP Counseling: had a lengthy discussion with the patient regarding the importance of PAP therapy in management of the sleep apnea. Patient appears to understand the risk factor reduction and also understands the risks associated with untreated sleep apnea. Patient will try to make a good faith effort to remain compliant with therapy. Also instructed the patient on proper cleaning of the device including the water must be changed daily if possible and use of distilled water is preferred. Patient understands that the machine should be regularly cleaned with appropriate recommended cleaning solutions that do not damage the PAP machine for example given white vinegar and water rinses. Other methods such as ozone treatment may not be as good as these simple methods to achieve cleaning.  3. Morbid obesity (HCC) Obesity Counseling: Had a lengthy discussion regarding patients BMI and weight issues. Patient was instructed on portion control as well as increased activity. Also discussed caloric restrictions with trying to maintain intake less than 2000 Kcal. Discussions were made in  accordance with the 5As of weight management. Simple actions such as not eating late and if able to, taking a walk is suggested.  4. Hypertension, unspecified type Hypertension Counseling:   The following hypertensive lifestyle modification were recommended and  discussed:  1. Limiting alcohol intake to less than 1 oz/day of ethanol:(24 oz of beer or 8 oz of wine or 2 oz of 100-proof whiskey). 2. Take baby ASA 81 mg daily. 3. Importance of regular aerobic exercise and losing weight. 4. Reduce dietary saturated fat and cholesterol intake for overall cardiovascular health. 5. Maintaining adequate dietary potassium, calcium, and magnesium intake. 6. Regular monitoring of the blood pressure. 7. Reduce sodium intake to less than 100 mmol/day (less than 2.3 gm of sodium or less than 6 gm of sodium choride)    General Counseling: I have discussed the findings of the evaluation and examination with Julie Sawyer.  I have also discussed any further diagnostic evaluation thatmay be needed or ordered today. Julie Sawyer verbalizes understanding of the findings of todays visit. We also reviewed her medications today and discussed drug interactions and side effects including but not limited excessive drowsiness and altered mental states. We also discussed that there is always a risk not just to her but also people around her. she has been encouraged to call the office with any questions or concerns that should arise related to todays visit.  No orders of the defined types were placed in this encounter.       I have personally obtained a history, examined the patient, evaluated laboratory and imaging results, formulated the assessment and plan and placed orders.  This patient was seen today by Emmaline KluverSarah Terrell, PA-C in collaboration with Dr. Freda MunroSaadat .  Valentino HueKathe G. Sol BlazingHenke, PhD, FAASM  Diplomate, American Board of Sleep Medicine    Yevonne PaxSaadat A , MD [facility] Diplomate ABMS Pulmonary and Critical Care Medicine Sleep  medicine

## 2021-03-07 NOTE — Patient Instructions (Signed)

## 2021-04-04 ENCOUNTER — Other Ambulatory Visit: Payer: Self-pay | Admitting: Family Medicine

## 2021-04-04 DIAGNOSIS — Z780 Asymptomatic menopausal state: Secondary | ICD-10-CM

## 2021-04-26 ENCOUNTER — Ambulatory Visit
Admission: RE | Admit: 2021-04-26 | Discharge: 2021-04-26 | Disposition: A | Discharge status: Home / Self Care | Payer: Medicare Other | Source: Ambulatory Visit | Attending: Family Medicine | Admitting: Family Medicine

## 2021-04-26 ENCOUNTER — Other Ambulatory Visit: Payer: Self-pay

## 2021-04-26 DIAGNOSIS — Z780 Asymptomatic menopausal state: Secondary | ICD-10-CM | POA: Insufficient documentation

## 2021-05-24 ENCOUNTER — Other Ambulatory Visit (INDEPENDENT_AMBULATORY_CARE_PROVIDER_SITE_OTHER): Payer: Self-pay | Admitting: Nurse Practitioner

## 2021-05-24 DIAGNOSIS — I739 Peripheral vascular disease, unspecified: Secondary | ICD-10-CM

## 2021-05-27 ENCOUNTER — Ambulatory Visit (INDEPENDENT_AMBULATORY_CARE_PROVIDER_SITE_OTHER): Payer: Medicare Other

## 2021-05-27 ENCOUNTER — Other Ambulatory Visit: Payer: Self-pay

## 2021-05-27 ENCOUNTER — Ambulatory Visit (INDEPENDENT_AMBULATORY_CARE_PROVIDER_SITE_OTHER): Payer: Medicare Other | Admitting: Nurse Practitioner

## 2021-05-27 VITALS — BP 188/73 | HR 74 | Ht 59.0 in | Wt 292.0 lb

## 2021-05-27 DIAGNOSIS — R609 Edema, unspecified: Secondary | ICD-10-CM

## 2021-05-27 DIAGNOSIS — I739 Peripheral vascular disease, unspecified: Secondary | ICD-10-CM

## 2021-05-27 DIAGNOSIS — I10 Essential (primary) hypertension: Secondary | ICD-10-CM | POA: Diagnosis not present

## 2021-06-05 ENCOUNTER — Encounter (INDEPENDENT_AMBULATORY_CARE_PROVIDER_SITE_OTHER): Payer: Self-pay | Admitting: Nurse Practitioner

## 2021-06-05 NOTE — Progress Notes (Signed)
Subjective:    Patient ID: Julie Sawyer, female    DOB: June 20, 1956, 65 y.o.   MRN: [id number] Chief Complaint  Patient presents with   New Patient (Initial Visit)    New patient [place] and consult      Julie Sawyer is a 65 year old female is seen for evaluation of painful lower extremities and diminished pulses. Patient notes the pain is always associated with activity and is very consistent day today. Typically, the pain occurs at less than one block, progress is as activity continues to the point that the patient must stop walking. Resting including standing still for several minutes allowed resumption of the activity and the ability to walk a similar distance before stopping again. Uneven terrain and inclined shorten the distance. The pain has been progressive over the past several years. The patient states the inability to walk is now having a profound negative impact on quality of life and daily activities.  The patient denies rest pain or dangling of an extremity off the side of the bed during the night for relief. No open wounds or sores at this time. No prior interventions or surgeries.  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   The patient denies changes in claudication symptoms or new rest pain symptoms.  No new ulcers or wounds of the foot.  The patient's blood pressure has been stable and relatively well controlled. The patient denies amaurosis fugax or recent TIA symptoms. There are no recent neurological changes noted. The patient denies history of DVT, PE or superficial thrombophlebitis. The patient denies recent episodes of angina or shortness of breath.  Today noninvasive studies show an ABI of 0.92 on the right and 1.06 on the left.  The patient has triphasic tibial artery waveforms bilaterally with good toe waveforms bilaterally.   Review of Systems  Cardiovascular:  Positive for leg swelling.  Musculoskeletal:  Positive for back pain.   Neurological:  Positive for numbness.  All other systems reviewed and are negative.     Objective:   Physical Exam Vitals reviewed.  Constitutional:      Appearance: She is obese.  HENT:     Head: Normocephalic.  Cardiovascular:     Rate and Rhythm: Normal rate.     Pulses: Normal pulses.  Pulmonary:     Effort: Pulmonary effort is normal.  Musculoskeletal:     Right lower leg: Edema present.     Left lower leg: Edema present.  Neurological:     Mental Status: She is alert and oriented to person, place, and time.  Psychiatric:        Mood and Affect: Mood normal.        Behavior: Behavior normal.        Thought Content: Thought content normal.        Judgment: Judgment normal.    BP (!) 188/73   Pulse 74   Ht 4\' 11"  (1.499 m)   Wt 292 lb (132.5 kg)   BMI 58.98 kg/m   Past Medical History:  Diagnosis Date   Anemia    Arthritis    Bursitis    Coronary artery disease    Diabetes mellitus without complication (HCC)    Elevated lipids    Gout    Hypertension    Neuropathy    Obesity    Shortness of breath dyspnea    Sleep apnea    use CPAP   Vaginal bleeding  Social History   Socioeconomic History   Marital status: Married    Spouse name: Not on file   Number of children: Not on file   Years of education: Not on file   Highest education level: Not on file  Occupational History   Not on file  Tobacco Use   Smoking status: Former    Packs/day: 0.75    Years: 20.00    Pack years: 15.00    Types: Cigarettes    Quit date: 11/13/1997    Years since quitting: 23.5   Smokeless tobacco: Never  Vaping Use   Vaping Use: Never used  Substance and Sexual Activity   Alcohol use: No   Drug use: No   Sexual activity: Not on file  Other Topics Concern   Not on file  Social History Narrative   Not on file   Social Determinants of Health   Financial Resource Strain: Not on file  Food Insecurity: Not on file  Transportation Needs: Not on file   Physical Activity: Not on file  Stress: Not on file  Social Connections: Not on file  Intimate Partner Violence: Not on file    Past Surgical History:  Procedure Laterality Date   BREAST BIOPSY Left    neg   BREAST SURGERY Left    CATARACT EXTRACTION W/PHACO Right 06/03/2020   Procedure: CATARACT EXTRACTION PHACO AND INTRAOCULAR LENS PLACEMENT (IOC) RIGHT DIABETIC;  Surgeon: Elliot CousinHarrow, Brian, MD;  Location: [facility];  Service: Ophthalmology;  Laterality: Right;  Lot # [id number] H US;00:20.0 CDE: 2.69   CATARACT EXTRACTION W/PHACO Left 07/23/2020   Procedure: CATARACT EXTRACTION PHACO AND INTRAOCULAR LENS PLACEMENT (I[place]) LEFT VISION BLUE DIABETIC;  Surgeon: Elliot CousinHarrow, Brian, MD;  Location: [facility];  Service: Ophthalmology;  Laterality: Left;  US 00:17.5 CDE 2.97 AP% 8.2 Fluid Pack Lot # [id number]   CESAREAN SECTION  1989   DILATATION & CURETTAGE/HYSTEROSCOPY WITH MYOSURE N/A 08/28/2017   Procedure: DILATATION & CURETTAGE/HYSTEROSCOPY WITH MYOSURE;  Surgeon: Conard NovakJackson, Stephen D, MD;  Location: [facility];  Service: Gynecology;  Laterality: N/A;   DILATION AND CURETTAGE OF UTERUS     ENDOMETRIAL ABLATION N/A 08/28/2017   Procedure: ENDOMETRIAL POLYPECTOMY;  Surgeon: Conard NovakJackson, Stephen D, MD;  Location: [facility];  Service: Gynecology;  Laterality: N/A;   TONSILLECTOMY     TUBAL LIGATION      Family History  Adopted: Yes    Allergies  Allergen Reactions   Penicillins Other (See Comments)    Childhood reaction.    CBC Latest Ref Rng & Units 07/23/2020 07/31/2017 01/04/2017  WBC 3.6 - 11.0 K/uL - 10.3 10.7  Hemoglobin 12.0 - 15.0 g/dL 10.9(L) 10.5(L) 9.8(L)  Hematocrit 36.0 - 46.0 % 32.0(L) 33.0(L) 30.2(L)  Platelets 150 - 440 K/uL - 258 282      CMP     Component Value Date/Time   NA 144 07/23/2020 0735   NA 138 03/17/2013 1001   K 3.7 07/23/2020 0735   K 3.6 03/17/2013 1001   CL 104 07/23/2020 0735   CL 104 03/17/2013 1001   CO2 26 07/31/2017 0954   CO2 27 03/17/2013 1001    GLUCOSE 151 (H) 07/23/2020 0735   GLUCOSE 104 (H) 03/17/2013 1001   BUN 24 (H) 07/23/2020 0735   BUN 20 (H) 03/17/2013 1001   CREATININE 0.80 07/23/2020 0735   CREATININE 0.95 03/17/2013 1001   CALCIUM 9.1 07/31/2017 0954   CALCIUM 9.5 03/17/2013 1001   PROT 7.6 07/31/2017 0954   ALBUMIN 3.7 07/31/2017 0954  AST 20 07/31/2017 0954   ALT 17 07/31/2017 0954   ALKPHOS 62 07/31/2017 0954   BILITOT 0.3 07/31/2017 0954   GFRNONAA >60 07/31/2017 0954   GFRNONAA >60 03/17/2013 1001   GFRAA >60 07/31/2017 0954   GFRAA >60 03/17/2013 1001     VAS [place] ABI WITH/WO TBI  Result Date: 05/31/2021  LOWER EXTREMITY DOPPLER STUDY Patient Name:  ERIELLE GAWRONSKI  Date of Exam:   05/27/2021 Medical Rec #: [id number]       Accession #:    [id number] Date of Birth: [birth year]       Patient Gender: F Patient Age:   1Y Exam Location:  [facility] Procedure:      VAS [place] ABI WITH/WO TBI Referring Phys: [id number] Erma Pinto  --------------------------------------------------------------------------------  Indications: Claudication, and peripheral artery disease.  Performing Technologist: Reece Agar RT (R)(VS)  Examination Guidelines: A complete evaluation includes at minimum, Doppler waveform signals and systolic blood pressure reading at the level of bilateral brachial, anterior tibial, and posterior tibial arteries, when vessel segments are accessible. Bilateral testing is considered an integral part of a complete examination. Photoelectric Plethysmograph (PPG) waveforms and toe systolic pressure readings are included as required and additional duplex testing as needed. Limited examinations for reoccurring indications may be performed as noted.  ABI Findings: +---------+------------------+-----+---------+--------+ Right    Rt Pressure (mmHg)IndexWaveform Comment  +---------+------------------+-----+---------+--------+ Brachial 162                                       +---------+------------------+-----+---------+--------+ ATA      165               0.92 triphasic         +---------+------------------+-----+---------+--------+ PTA      164               0.92 triphasic         +---------+------------------+-----+---------+--------+ Great Toe                       Normal   [place]       +---------+------------------+-----+---------+--------+ +---------+------------------+-----+---------+-------+ Left     Lt Pressure (mmHg)IndexWaveform Comment +---------+------------------+-----+---------+-------+ Brachial 179                                     +---------+------------------+-----+---------+-------+ ATA      190               1.06 triphasic        +---------+------------------+-----+---------+-------+ PTA      147               0.82 triphasic        +---------+------------------+-----+---------+-------+ Great Toe                       Normal   [place]      +---------+------------------+-----+---------+-------+ Summary: Right: Resting right ankle-brachial index indicates mild right lower extremity arterial disease. The right toe-brachial index is normal. Left: Resting left ankle-brachial index is within normal range. No evidence of significant left lower extremity arterial disease. The left toe-brachial index is normal.  *See table(s) above for measurements and observations.  Electronically signed by Festus Barren MD on 05/31/2021 at 1:32:23 PM.    Final        Assessment &  Plan:   1. PVD (peripheral vascular disease) (HCC) Recommend:  I do not find evidence of Vascular pathology that would explain the patient's symptoms  The patient has atypical pain symptoms for vascular disease  I do not find evidence of Vascular pathology that would explain the patient's symptoms and I suspect the patient is c/o pseudoclaudication.  Patient should have an evaluation of his LS spine which I defer to the primary service.  Noninvasive studies  including venous ultrasound of the legs do not identify vascular problems  The patient should continue walking and begin a more formal exercise program. The patient should continue his antiplatelet therapy and aggressive treatment of the lipid abnormalities.  Patient will follow-up with me on a PRN basis  Further work-up of her lower extremity pain is deferred to the primary service      2. Hypertension, unspecified type Continue antihypertensive medications as already ordered, these medications have been reviewed and there are no changes at this time.  BP is elevated.  Patient is advised   3. Edema, unspecified type I have had a long discussion with the patient regarding swelling and why it  causes symptoms.  Patient will begin wearing graduated compression stockings class 1 (20-30 mmHg) on a daily basis a prescription was given. The patient will  beginning wearing the stockings first thing in the morning and removing them in the evening. The patient is instructed specifically not to sleep in the stockings.   In addition, behavioral modification will be initiated.  This will include frequent elevation, use of over the counter pain medications and exercise such as walking.  I have reviewed systemic causes for chronic edema such as liver, kidney and cardiac etiologies.  The patient denies problems with these organ systems.    Consideration for a lymph pump will also be made based upon the effectiveness of conservative therapy.  This would help to improve the edema control and prevent sequela such as ulcers and infections  She will follow-up in 6 months for evaluation progression with edema  Current Outpatient Medications on File Prior to Visit  Medication Sig Dispense Refill   Ascorbic Acid (SM CHEWABLE VITAMIN C) 500 MG CHEW Chew 500 mg by mouth daily.     aspirin EC 81 MG tablet Take 81 mg by mouth daily. Swallow whole.     Biotin w/ Vitamins C & E (HAIR/SKIN/NAILS PO) Take 2 tablets by  mouth daily. Gummy     Black Elderberry (SAMBUCUS ELDERBERRY PO) Take 1,250 mg by mouth daily.     carvedilol (COREG) 6.25 MG tablet Take 6.25 mg by mouth 2 (two) times daily.     cefdinir (OMNICEF) 300 MG capsule Take 300 mg by mouth 2 (two) times daily.     cloNIDine (CATAPRES) 0.1 MG tablet Take 0.1 mg by mouth 2 (two) times daily.     furosemide (LASIX) 20 MG tablet Take 20 mg by mouth 2 (two) times daily.  0   gabapentin (NEURONTIN) 300 MG capsule Take 300 mg by mouth 2 (two) times daily.      hydrALAZINE (APRESOLINE) 100 MG tablet Take 100-200 mg by mouth See admin instructions. Take 2 tablets (200 mg) by mouth in the morning & take 1 tablet (100 mg) by mouth in the afternoon.     indomethacin (INDOCIN) 50 MG capsule Take 50 mg by mouth 2 (two) times daily as needed for moderate pain (pain (gout)).   0   insulin detemir (LEVEMIR) 100 UNIT/ML injection Inject 60  Units into the skin 2 (two) times daily.      LEVEMIR FLEXTOUCH 100 UNIT/ML FlexPen      liraglutide (VICTOZA) 18 MG/3ML SOPN Inject 1.2 mg into the skin daily.     losartan (COZAAR) 100 MG tablet Take 100 mg by mouth daily.     meloxicam (MOBIC) 15 MG tablet Take 15 mg by mouth daily.     pentoxifylline (TRENTAL) 400 MG CR tablet Take 400 mg by mouth daily.     rosuvastatin (CRESTOR) 20 MG tablet Take 20 mg by mouth at bedtime.      SLOW FE 142 (45 Fe) MG TBCR Take 1 tablet by mouth daily.     No current facility-administered medications on file prior to visit.    There are no Patient Instructions on file for this visit. No follow-ups on file.   Georgiana Spinner, NP

## 2021-12-01 ENCOUNTER — Other Ambulatory Visit (INDEPENDENT_AMBULATORY_CARE_PROVIDER_SITE_OTHER): Payer: Self-pay | Admitting: Nurse Practitioner

## 2021-12-01 DIAGNOSIS — I739 Peripheral vascular disease, unspecified: Secondary | ICD-10-CM

## 2021-12-02 ENCOUNTER — Ambulatory Visit (INDEPENDENT_AMBULATORY_CARE_PROVIDER_SITE_OTHER): Payer: Medicare Other | Admitting: Nurse Practitioner

## 2021-12-02 ENCOUNTER — Encounter (INDEPENDENT_AMBULATORY_CARE_PROVIDER_SITE_OTHER): Payer: Medicare Other

## 2021-12-02 ENCOUNTER — Other Ambulatory Visit: Payer: Self-pay

## 2021-12-02 ENCOUNTER — Ambulatory Visit (INDEPENDENT_AMBULATORY_CARE_PROVIDER_SITE_OTHER): Payer: Medicare Other

## 2021-12-02 ENCOUNTER — Encounter (INDEPENDENT_AMBULATORY_CARE_PROVIDER_SITE_OTHER): Payer: Self-pay | Admitting: Nurse Practitioner

## 2021-12-02 VITALS — BP 172/71 | HR 80 | Resp 16 | Wt 288.0 lb

## 2021-12-02 DIAGNOSIS — I10 Essential (primary) hypertension: Secondary | ICD-10-CM | POA: Diagnosis not present

## 2021-12-02 DIAGNOSIS — I739 Peripheral vascular disease, unspecified: Secondary | ICD-10-CM

## 2021-12-03 ENCOUNTER — Encounter (INDEPENDENT_AMBULATORY_CARE_PROVIDER_SITE_OTHER): Payer: Self-pay | Admitting: Nurse Practitioner

## 2021-12-03 NOTE — Progress Notes (Signed)
Subjective:    Patient ID: Julie Sawyer, female    DOB: 1956-05-29, 66 y.o.   MRN: [id number] Chief Complaint  Patient presents with   Follow-up    Ultrasound follow up    Julie Sawyer is a 66 year old female that returns today for follow-up evaluation of peripheral vascular disease.  The patient notes that her swelling is better controlled however her ability to exercise has been decreased after recent ankle sprain.  She has also begun with aerobics and this has been helpful in controlling her edema.  She endorses having some claudication-like symptoms in the left lower extremity that is typically only present when she has been climbing up stairs.  She denies any rest pain or ulcerations.    Today noninvasive studies show an ABI of 1.19 on the right and 1.12 on the left.  She has normal TBI's with good toe waveforms and good triphasic waveforms bilaterally.  The patient also had an aortoiliac duplex with elevated velocities in the bilateral external and common iliac arteries.   Review of Systems  Cardiovascular:  Positive for leg swelling.  All other systems reviewed and are negative.     Objective:   Physical Exam Vitals reviewed.  HENT:     Head: Normocephalic.  Cardiovascular:     Rate and Rhythm: Normal rate.     Pulses:          Dorsalis pedis pulses are 1+ on the right side and 1+ on the left side.  Pulmonary:     Effort: Pulmonary effort is normal.  Musculoskeletal:     Right lower leg: 1+ Edema present.     Left lower leg: 1+ Edema present.  Neurological:     Mental Status: She is alert and oriented to person, place, and time.  Psychiatric:        Mood and Affect: Mood normal.        Behavior: Behavior normal.        Thought Content: Thought content normal.        Judgment: Judgment normal.    BP (!) 172/71 (BP Location: Left Arm)    Pulse 80    Resp 16    Wt 288 lb (130.6 kg)    BMI 58.17 kg/m   Past Medical History:  Diagnosis Date   Anemia    Arthritis     Bursitis    Coronary artery disease    Diabetes mellitus without complication (HCC)    Elevated lipids    Gout    Hypertension    Neuropathy    Obesity    Shortness of breath dyspnea    Sleep apnea    use CPAP   Vaginal bleeding     Social History   Socioeconomic History   Marital status: Married    Spouse name: Not on file   Number of children: Not on file   Years of education: Not on file   Highest education level: Not on file  Occupational History   Not on file  Tobacco Use   Smoking status: Former    Packs/day: 0.75    Years: 20.00    Pack years: 15.00    Types: Cigarettes    Quit date: 11/13/1997    Years since quitting: 24.0   Smokeless tobacco: Never  Vaping Use   Vaping Use: Never used  Substance and Sexual Activity   Alcohol use: No   Drug use: No   Sexual activity: Not on file  Other Topics  Concern   Not on file  Social History Narrative   Not on file   Social Determinants of Health   Financial Resource Strain: Not on file  Food Insecurity: Not on file  Transportation Needs: Not on file  Physical Activity: Not on file  Stress: Not on file  Social Connections: Not on file  Intimate Partner Violence: Not on file    Past Surgical History:  Procedure Laterality Date   BREAST BIOPSY Left    neg   BREAST SURGERY Left    CATARACT EXTRACTION W/PHACO Right 06/03/2020   Procedure: CATARACT EXTRACTION PHACO AND INTRAOCULAR LENS PLACEMENT ([place]) RIGHT DIABETIC;  Surgeon: Marchia Meiers, MD;  Location: [facility];  Service: Ophthalmology;  Laterality: Right;  Lot # [id number] H US;00:20.0 CDE: 2.69   CATARACT EXTRACTION W/PHACO Left 07/23/2020   Procedure: CATARACT EXTRACTION PHACO AND INTRAOCULAR LENS PLACEMENT ([place]) LEFT VISION BLUE DIABETIC;  Surgeon: Marchia Meiers, MD;  Location: [facility];  Service: Ophthalmology;  Laterality: Left;  [place] 00:17.5 CDE 2.97 AP% 8.2 Fluid Pack Lot # [id number]   CESAREAN SECTION  1989   DILATATION & CURETTAGE/HYSTEROSCOPY WITH  MYOSURE N/A 08/28/2017   Procedure: DILATATION & CURETTAGE/HYSTEROSCOPY WITH MYOSURE;  Surgeon: Will Bonnet, MD;  Location: [facility];  Service: Gynecology;  Laterality: N/A;   DILATION AND CURETTAGE OF UTERUS     ENDOMETRIAL ABLATION N/A 08/28/2017   Procedure: ENDOMETRIAL POLYPECTOMY;  Surgeon: Will Bonnet, MD;  Location: [facility];  Service: Gynecology;  Laterality: N/A;   TONSILLECTOMY     TUBAL LIGATION      Family History  Adopted: Yes    Allergies  Allergen Reactions   Penicillins Other (See Comments)    Childhood reaction.    CBC Latest Ref Rng & Units 07/23/2020 07/31/2017 01/04/2017  WBC 3.6 - 11.0 K/uL - 10.3 10.7  Hemoglobin 12.0 - 15.0 g/dL 10.9(L) 10.5(L) 9.8(L)  Hematocrit 36.0 - 46.0 % 32.0(L) 33.0(L) 30.2(L)  Platelets 150 - 440 K/uL - 258 282      CMP     Component Value Date/Time   NA 144 07/23/2020 0735   NA 138 03/17/2013 1001   K 3.7 07/23/2020 0735   K 3.6 03/17/2013 1001   CL 104 07/23/2020 0735   CL 104 03/17/2013 1001   CO2 26 07/31/2017 0954   CO2 27 03/17/2013 1001   GLUCOSE 151 (H) 07/23/2020 0735   GLUCOSE 104 (H) 03/17/2013 1001   BUN 24 (H) 07/23/2020 0735   BUN 20 (H) 03/17/2013 1001   CREATININE 0.80 07/23/2020 0735   CREATININE 0.95 03/17/2013 1001   CALCIUM 9.1 07/31/2017 0954   CALCIUM 9.5 03/17/2013 1001   PROT 7.6 07/31/2017 0954   ALBUMIN 3.7 07/31/2017 0954   AST 20 07/31/2017 0954   ALT 17 07/31/2017 0954   ALKPHOS 62 07/31/2017 0954   BILITOT 0.3 07/31/2017 0954   GFRNONAA >60 07/31/2017 0954   GFRNONAA >60 03/17/2013 1001   GFRAA >60 07/31/2017 0954   GFRAA >60 03/17/2013 1001     No results found.     Assessment & Plan:   1. PVD (peripheral vascular disease) (HCC) Elevated velocities noted in the iliac arteries or typically associated with a at least 50% stenosis or greater.  However since the patient is not having significant claudication-like symptoms or rest pain we will continue with close  follow-up.  The patient is advised that if she begins to have claudication symptoms that occur regularly and begin to prohibit her life she should  contact us sooner.  Otherwise we will have the patient return in 1 year for noninvasive studies  2. Hypertension, unspecified type Today the patient's blood pressure is elevated however this may have been related to anxiety related to her office visit.  Following the office visit to decrease.  Patient should continue with current medications.  No changes today.  However noted blood pressure elevation today she should reach out to her primary care provider.   Current Outpatient Medications on File Prior to Visit  Medication Sig Dispense Refill   Ascorbic Acid (SM CHEWABLE VITAMIN C) 500 MG CHEW Chew 500 mg by mouth daily.     aspirin EC 81 MG tablet Take 81 mg by mouth daily. Swallow whole.     carvedilol (COREG) 6.25 MG tablet Take 6.25 mg by mouth 2 (two) times daily.     cloNIDine (CATAPRES) 0.1 MG tablet Take 0.1 mg by mouth 2 (two) times daily.     furosemide (LASIX) 20 MG tablet Take 20 mg by mouth 2 (two) times daily.  0   gabapentin (NEURONTIN) 300 MG capsule Take 300 mg by mouth 2 (two) times daily.      hydrALAZINE (APRESOLINE) 100 MG tablet Take 100-200 mg by mouth See admin instructions. Take 2 tablets (200 mg) by mouth in the morning & take 1 tablet (100 mg) by mouth in the afternoon.     indomethacin (INDOCIN) 50 MG capsule Take 50 mg by mouth 2 (two) times daily as needed for moderate pain (pain (gout)).   0   insulin detemir (LEVEMIR) 100 UNIT/ML injection Inject 60 Units into the skin 2 (two) times daily.      LEVEMIR FLEXTOUCH 100 UNIT/ML FlexPen      liraglutide (VICTOZA) 18 MG/3ML SOPN Inject 1.2 mg into the skin daily.     losartan (COZAAR) 100 MG tablet Take 100 mg by mouth daily.     pentoxifylline (TRENTAL) 400 MG CR tablet Take 400 mg by mouth daily.     rosuvastatin (CRESTOR) 20 MG tablet Take 20 mg by mouth at bedtime.       Biotin w/ Vitamins C & E (HAIR/SKIN/NAILS PO) Take 2 tablets by mouth daily. Gummy (Patient not taking: Reported on 12/02/2021)     Black Elderberry (SAMBUCUS ELDERBERRY PO) Take 1,250 mg by mouth daily. (Patient not taking: Reported on 12/02/2021)     cefdinir (OMNICEF) 300 MG capsule Take 300 mg by mouth 2 (two) times daily. (Patient not taking: Reported on 12/02/2021)     meloxicam (MOBIC) 15 MG tablet Take 15 mg by mouth daily. (Patient not taking: Reported on 12/02/2021)     SLOW FE 142 (45 Fe) MG TBCR Take 1 tablet by mouth daily. (Patient not taking: Reported on 12/02/2021)     No current facility-administered medications on file prior to visit.    There are no Patient Instructions on file for this visit. No follow-ups on file.   Kris Hartmann, NP

## 2022-03-06 ENCOUNTER — Ambulatory Visit (INDEPENDENT_AMBULATORY_CARE_PROVIDER_SITE_OTHER): Payer: Medicare Other | Admitting: Internal Medicine

## 2022-03-06 VITALS — HR 72 | Resp 14 | Ht 59.0 in | Wt 294.0 lb

## 2022-03-06 DIAGNOSIS — G4733 Obstructive sleep apnea (adult) (pediatric): Secondary | ICD-10-CM

## 2022-03-06 DIAGNOSIS — I10 Essential (primary) hypertension: Secondary | ICD-10-CM

## 2022-03-06 DIAGNOSIS — Z7189 Other specified counseling: Secondary | ICD-10-CM

## 2022-03-06 DIAGNOSIS — Z9989 Dependence on other enabling machines and devices: Secondary | ICD-10-CM

## 2022-03-06 NOTE — Progress Notes (Signed)
[facility] ?[address] ?[place], [place] [address] ? ?Pulmonary Sleep Medicine  ? ?Office Visit Note ? ?Patient Name: Julie Sawyer ?DOB: [birth year] ?MRN [id number] ? ? ? ?Chief Complaint: Obstructive Sleep Apnea visit ? ?Brief History: ? ?Julie Sawyer is seen today for follow up.   The patient has a 8 year history of sleep apnea. Patient is using PAP nightly standard Mirage FX nasal mask.  The patient feels good, better after sleeping with PAP.  The patient reports benefiting from PAP use. Reported sleepiness is  resolve and the Epworth Sleepiness Score is 6 out of 24. The patient does not take naps. The patient complains of the following: no problems  The compliance download shows  compliance with an average use time of 8:50 hours@ 100%. The AHI is 0.6  The patient does not complain of limb movements disrupting sleep. ? ?ROS ? ?General: (-) fever, (-) chills, (-) night sweat ?Nose and Sinuses: (-) nasal stuffiness or itchiness, (-) postnasal drip, (-) nosebleeds, (-) sinus trouble. ?Mouth and Throat: (-) sore throat, (-) hoarseness. ?Neck: (-) swollen glands, (-) enlarged thyroid, (-) neck pain. ?Respiratory: - cough, - shortness of breath, - wheezing. ?Neurologic: - numbness, - tingling. ?Psychiatric: - anxiety, - depression ? ? ?Current Medication: ?Outpatient Encounter Medications as of 03/06/2022  ?Medication Sig Note  ? Ascorbic Acid (SM CHEWABLE VITAMIN C) 500 MG CHEW Chew 500 mg by mouth daily.   ? aspirin EC 81 MG tablet Take 81 mg by mouth daily. Swallow whole.   ? Biotin w/ Vitamins C & E (HAIR/SKIN/NAILS PO) Take 2 tablets by mouth daily. Gummy (Patient not taking: Reported on 12/02/2021)   ? Black Elderberry (SAMBUCUS ELDERBERRY PO) Take 1,250 mg by mouth daily. (Patient not taking: Reported on 12/02/2021)   ? carvedilol (COREG) 6.25 MG tablet Take 6.25 mg by mouth 2 (two) times daily.   ? cloNIDine (CATAPRES) 0.1 MG tablet Take 0.1 mg by mouth 2 (two) times daily.   ? furosemide (LASIX) 20 MG  tablet Take 20 mg by mouth 2 (two) times daily.   ? gabapentin (NEURONTIN) 300 MG capsule Take 300 mg by mouth 2 (two) times daily.    ? hydrALAZINE (APRESOLINE) 100 MG tablet Take 100-200 mg by mouth See admin instructions. Take 2 tablets (200 mg) by mouth in the morning & take 1 tablet (100 mg) by mouth in the afternoon.   ? indomethacin (INDOCIN) 50 MG capsule Take 50 mg by mouth 2 (two) times daily as needed for moderate pain (pain (gout)).    ? insulin detemir (LEVEMIR) 100 UNIT/ML injection Inject 60 Units into the skin 2 (two) times daily.    ? LEVEMIR FLEXTOUCH 100 UNIT/ML FlexPen    ? liraglutide (VICTOZA) 18 MG/3ML SOPN Inject 1.2 mg into the skin daily.   ? losartan (COZAAR) 100 MG tablet Take 100 mg by mouth daily.   ? meloxicam (MOBIC) 15 MG tablet Take 15 mg by mouth daily. (Patient not taking: Reported on 12/02/2021)   ? pentoxifylline (TRENTAL) 400 MG CR tablet Take 400 mg by mouth daily.   ? rosuvastatin (CRESTOR) 20 MG tablet Take 20 mg by mouth at bedtime.    ? SLOW FE 142 (45 Fe) MG TBCR Take 1 tablet by mouth daily. (Patient not taking: Reported on 12/02/2021) 07/15/2020: New medication patient has not started  ? [DISCONTINUED] cefdinir (OMNICEF) 300 MG capsule Take 300 mg by mouth 2 (two) times daily. (Patient not taking: Reported on 12/02/2021)   ? ?  No facility-administered encounter medications on file as of 03/06/2022.  ? ? ?Surgical History: ?Past Surgical History:  ?Procedure Laterality Date  ? BREAST BIOPSY Left   ? neg  ? BREAST SURGERY Left   ? CATARACT EXTRACTION W/PHACO Right 06/03/2020  ? Procedure: CATARACT EXTRACTION PHACO AND INTRAOCULAR LENS PLACEMENT (IOC) RIGHT DIABETIC;  Surgeon: Elliot Cousin, MD;  Location: [facility];  Service: Ophthalmology;  Laterality: Right;  Lot # [id number] H ?US;00:20.0 ?CDE: 2.69  ? CATARACT EXTRACTION W/PHACO Left 07/23/2020  ? Procedure: CATARACT EXTRACTION PHACO AND INTRAOCULAR LENS PLACEMENT (IOC) LEFT VISION BLUE DIABETIC;  Surgeon: Elliot Cousin, MD;   Location: [facility];  Service: Ophthalmology;  Laterality: Left;  [place] 00:17.5 ?CDE 2.97 ?AP% 8.2 ?Fluid Pack Lot # [id number]  ? CESAREAN SECTION  1989  ? DILATATION & CURETTAGE/HYSTEROSCOPY WITH MYOSURE N/A 08/28/2017  ? Procedure: DILATATION & CURETTAGE/HYSTEROSCOPY WITH MYOSURE;  Surgeon: Conard Novak, MD;  Location: [facility];  Service: Gynecology;  Laterality: N/A;  ? DILATION AND CURETTAGE OF UTERUS    ? ENDOMETRIAL ABLATION N/A 08/28/2017  ? Procedure: ENDOMETRIAL POLYPECTOMY;  Surgeon: Conard Novak, MD;  Location: [facility];  Service: Gynecology;  Laterality: N/A;  ? TONSILLECTOMY    ? TUBAL LIGATION    ? ? ?Medical History: ?Past Medical History:  ?Diagnosis Date  ? Anemia   ? Arthritis   ? Bursitis   ? Coronary artery disease   ? Diabetes mellitus without complication (HCC)   ? Elevated lipids   ? Gout   ? Hypertension   ? Neuropathy   ? Obesity   ? Shortness of breath dyspnea   ? Sleep apnea   ? use CPAP  ? Vaginal bleeding   ? ? ?Family History: ?Non contributory to the present illness ? ?Social History: ?Social History  ? ?Socioeconomic History  ? Marital status: Married  ?  Spouse name: Not on file  ? Number of children: Not on file  ? Years of education: Not on file  ? Highest education level: Not on file  ?Occupational History  ? Not on file  ?Tobacco Use  ? Smoking status: Former  ?  Packs/day: 0.75  ?  Years: 20.00  ?  Pack years: 15.00  ?  Types: Cigarettes  ?  Quit date: 11/13/1997  ?  Years since quitting: 24.3  ? Smokeless tobacco: Never  ?Vaping Use  ? Vaping Use: Never used  ?Substance and Sexual Activity  ? Alcohol use: No  ? Drug use: No  ? Sexual activity: Not on file  ?Other Topics Concern  ? Not on file  ?Social History Narrative  ? Not on file  ? ?Social Determinants of Health  ? ?Financial Resource Strain: Not on file  ?Food Insecurity: Not on file  ?Transportation Needs: Not on file  ?Physical Activity: Not on file  ?Stress: Not on file  ?Social Connections: Not on file  ?Intimate  Partner Violence: Not on file  ? ? ?Vital Signs: ?Pulse 72, resp. rate 14, height 4\' 11"  (1.499 m), weight 294 lb (133.4 kg), SpO2 94 %. ?Body mass index is 59.38 kg/m?.  ? ? ?Examination: ?General Appearance: The patient is well-developed, well-nourished, and in no distress. ?Neck Circumference: 43 cm ?Skin: Gross inspection of skin unremarkable. ?Head: normocephalic, no gross deformities. ?Eyes: no gross deformities noted. ?ENT: ears appear grossly normal ?Neurologic: Alert and oriented. No involuntary movements. ? ? ? ?EPWORTH SLEEPINESS SCALE: ? ?Scale:  ?(0)= no chance of dozing; (1)= slight chance of dozing; (  2)= moderate chance of dozing; (3)= high chance of dozing ? ?Chance  Situtation ?Sitting and reading:    2  ? ? Watching TV: 2 ?   ?Sitting Inactive in public: 1 ?   ?As a passenger in car: 0   ?   ?Lying down to rest: 0 ?   ?Sitting and talking: 0 ?   ?Sitting quielty after lunch: 1 ?   ?In a car, stopped in traffic: 0 ? ? ?TOTAL SCORE:   6 out of 24 ? ? ? ?SLEEP STUDIES: ? ?01/12/14  Split - AHI 24.4,  right lateral AHI 45.8, low SpO2 84%, 12cmH2O ,  ? ?CPAP COMPLIANCE DATA: ? ?Date Range: 03/02/21 - 03/01/22 ? ?Average Daily Use: 8:50 hours ? ?Median Use: 8:35 hours ? ?Compliance for > 4 Hours: 100% ? ?AHI: 0.6 respiratory events per hour ? ?Days Used: 365/365 ? ?Mask Leak: 60.1lpm ? ?95th Percentile Pressure: 12 cmH2O ? ? ? ?LABS: ?No results found for this or any previous visit (from the past 2160 hour(s)). ? ?Radiology: ?DG BONE DENSITY (DXA) ? ?Result Date: 04/26/2021 ?EXAM: DUAL X-RAY ABSORPTIOMETRY (DXA) FOR BONE MINERAL DENSITY IMPRESSION: Your patient Octavia HeirWanda Strough completed a BMD test on 04/26/2021 using the [employer] iDXA DXA System (software version: 14.10) manufactured by [employer] LUNAR. The following summarizes the results of our evaluation. Technologist: SCE PATIENT BIOGRAPHICAL: Name: Haskel SchroederOakley, Malaina V Patient ID: [id number] Birth Date: 1956/07/25 Height: 59.0 in. Gender: Female Exam  Date: 04/26/2021 Weight: 302.0 lbs. Indications: Diabetic, Osteoarthritis, Postmenopausal Fractures: Treatments: Gabapentin, Insulin, Victoza DENSITOMETRY RESULTS: Site         Region     Measured Date Measure

## 2022-03-06 NOTE — Patient Instructions (Signed)

## 2022-03-16 ENCOUNTER — Other Ambulatory Visit: Payer: Self-pay | Admitting: Family Medicine

## 2022-03-16 DIAGNOSIS — Z1231 Encounter for screening mammogram for malignant neoplasm of breast: Secondary | ICD-10-CM

## 2022-04-14 ENCOUNTER — Ambulatory Visit
Admission: RE | Admit: 2022-04-14 | Discharge: 2022-04-14 | Disposition: A | Discharge status: Home / Self Care | Payer: Medicare Other | Source: Ambulatory Visit | Attending: Family Medicine | Admitting: Family Medicine

## 2022-04-14 DIAGNOSIS — Z1231 Encounter for screening mammogram for malignant neoplasm of breast: Secondary | ICD-10-CM | POA: Insufficient documentation

## 2022-04-20 ENCOUNTER — Other Ambulatory Visit: Payer: Self-pay | Admitting: Family Medicine

## 2022-04-20 DIAGNOSIS — N63 Unspecified lump in unspecified breast: Secondary | ICD-10-CM

## 2022-04-20 DIAGNOSIS — R928 Other abnormal and inconclusive findings on diagnostic imaging of breast: Secondary | ICD-10-CM

## 2022-04-27 ENCOUNTER — Ambulatory Visit
Admission: RE | Admit: 2022-04-27 | Discharge: 2022-04-27 | Disposition: A | Discharge status: Home / Self Care | Payer: Medicare Other | Source: Ambulatory Visit | Attending: Family Medicine | Admitting: Family Medicine

## 2022-04-27 DIAGNOSIS — N63 Unspecified lump in unspecified breast: Secondary | ICD-10-CM | POA: Diagnosis present

## 2022-04-27 DIAGNOSIS — R928 Other abnormal and inconclusive findings on diagnostic imaging of breast: Secondary | ICD-10-CM | POA: Diagnosis present

## 2022-05-12 ENCOUNTER — Other Ambulatory Visit: Payer: Self-pay | Admitting: Family Medicine

## 2022-05-12 DIAGNOSIS — N63 Unspecified lump in unspecified breast: Secondary | ICD-10-CM

## 2022-07-03 IMAGING — US US AXILLARY LEFT
1 series · 10 of 10 positions shown · non-contrast
Comparison: None available.

CLINICAL DATA: Patient recalled from screening for possible left
axillary adenopathy.

EXAM:
ULTRASOUND OF THE LEFT AXILLA

[Series 1: us breast 10 minutes · 10 of 10 slices shown]
[im 1/10]
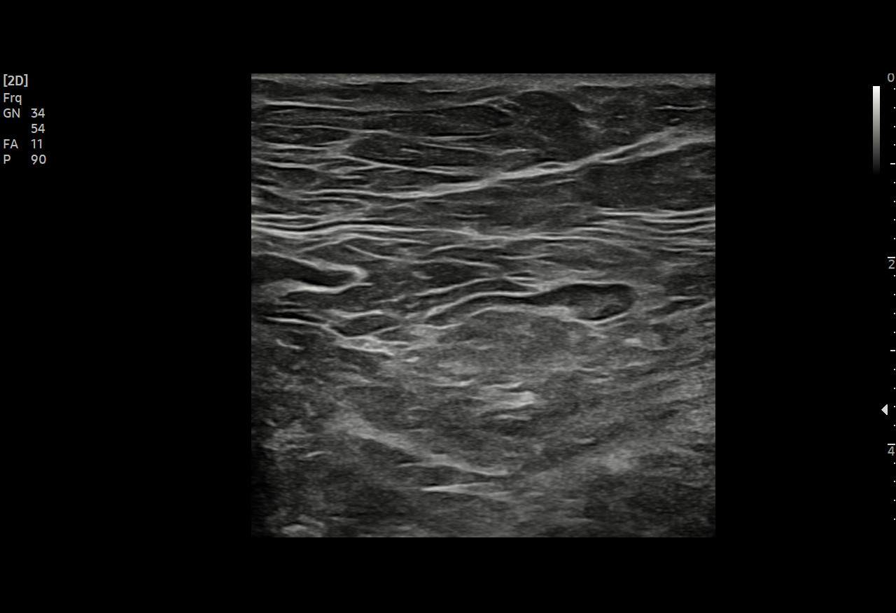
[im 2/10]
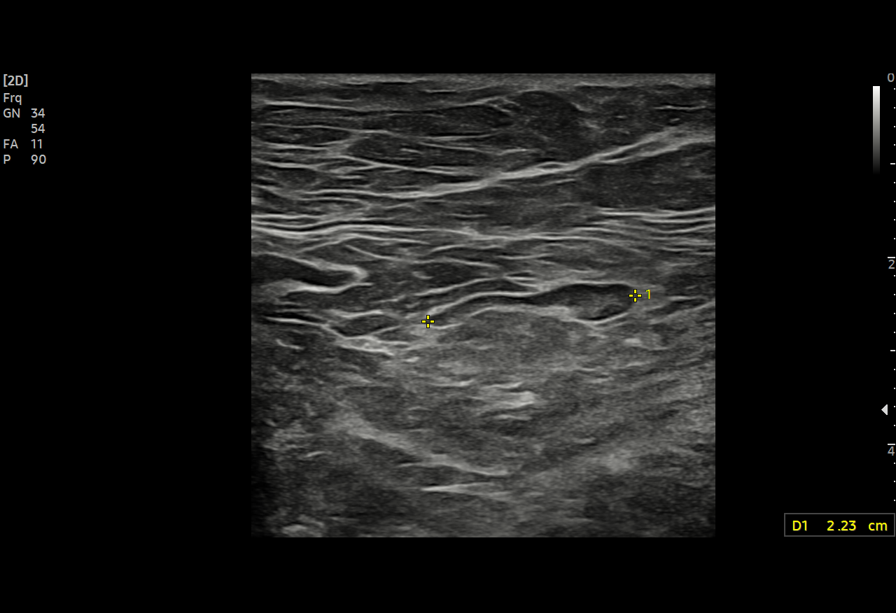
[im 3/10]
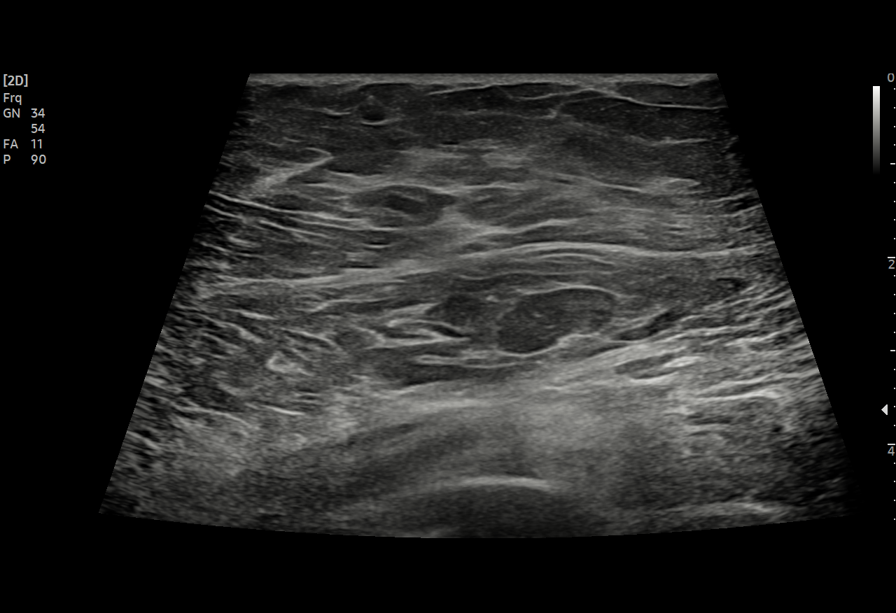
[im 4/10]
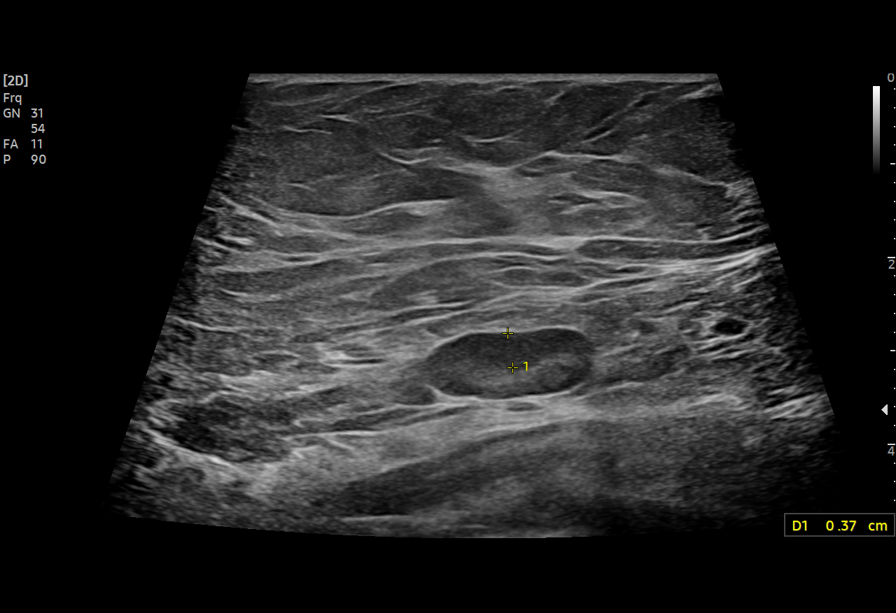
[im 5/10]
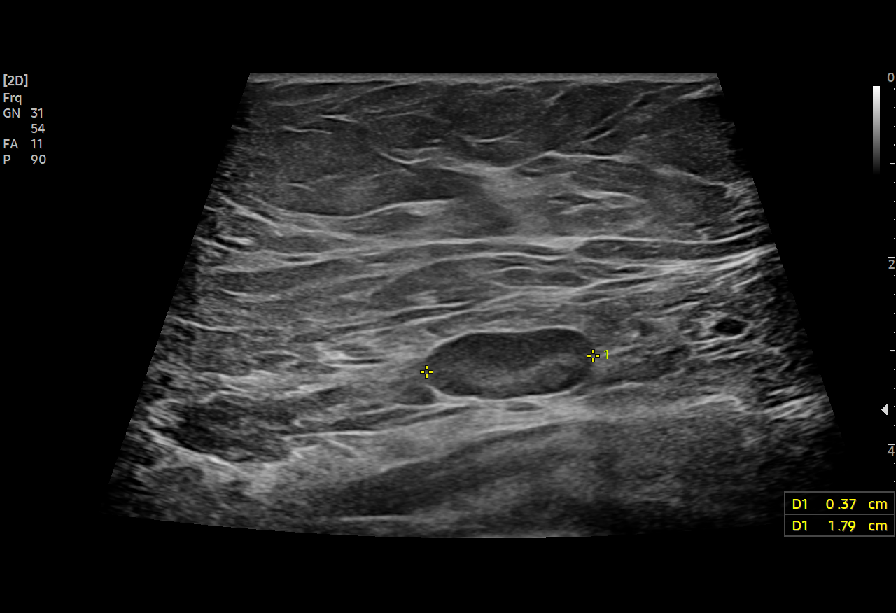
[im 6/10]
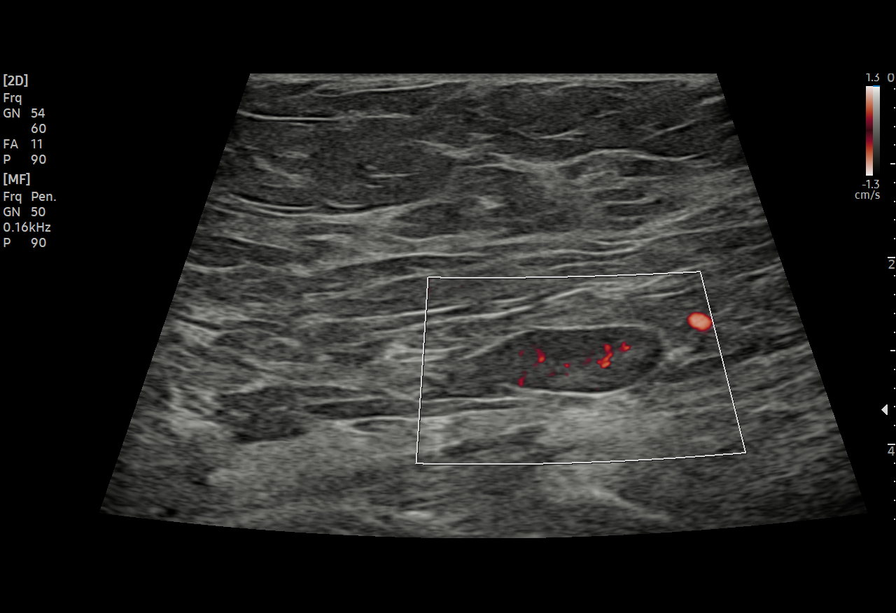
[im 7/10]
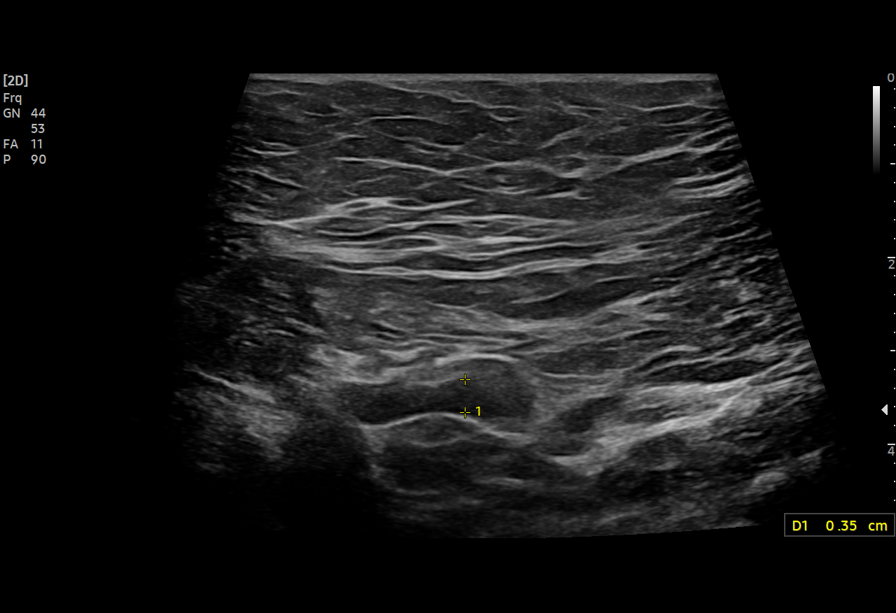
[im 8/10]
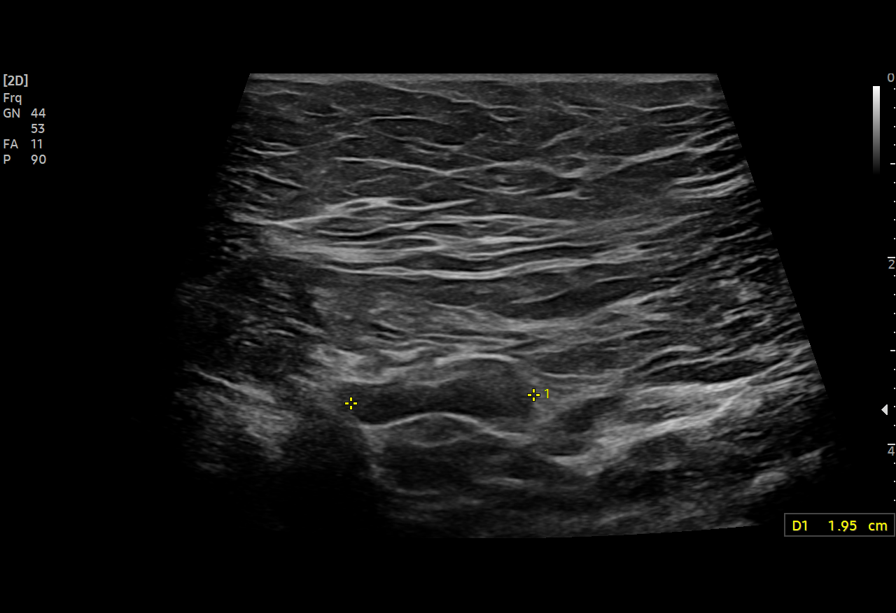
[im 9/10]
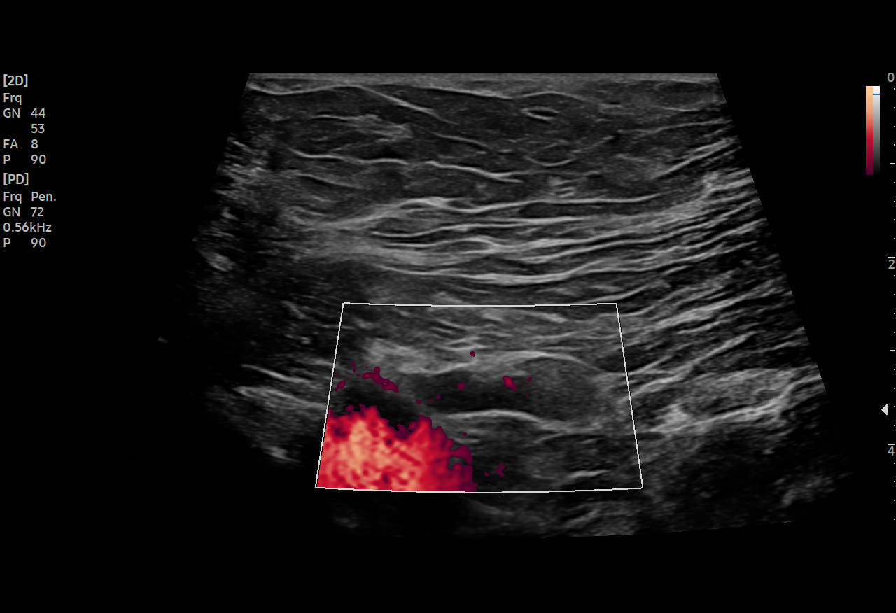
[im 10/10]
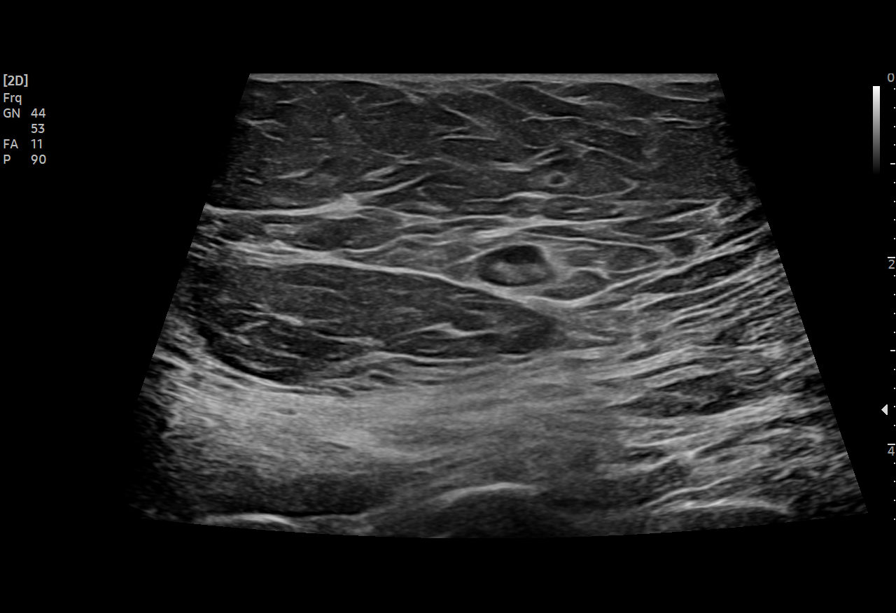

[10 of 10 positions shown; findings below may reference images not displayed]

FINDINGS: On physical exam,no discrete left axillary mass is palpated.

Ultrasound is performed, showing a few prominent lymph nodes within
the left axilla a thickened measuring up 4 mm, likely corresponding
with the mammographically identified mass.
IMPRESSION: There are 2 mildly prominent left axillary lymph nodes, 1 of which
is felt to correspond with the mammographic abnormality.

RECOMMENDATION:
Left breast mammogram with attention to the left axilla and left
axillary ultrasound in 3 months to reassess the likely reactive left
axillary lymph nodes.

I have discussed the findings and recommendations with the patient.
If applicable, a reminder letter will be sent to the patient
regarding the next appointment.

BI-RADS CATEGORY  3: Probably benign.

## 2022-07-31 ENCOUNTER — Other Ambulatory Visit: Payer: Self-pay | Admitting: Family Medicine

## 2022-07-31 ENCOUNTER — Ambulatory Visit
Admission: RE | Admit: 2022-07-31 | Discharge: 2022-07-31 | Disposition: A | Discharge status: Home / Self Care | Payer: Medicare Other | Source: Ambulatory Visit | Attending: Family Medicine | Admitting: Family Medicine

## 2022-07-31 DIAGNOSIS — R590 Localized enlarged lymph nodes: Secondary | ICD-10-CM | POA: Insufficient documentation

## 2022-07-31 DIAGNOSIS — N63 Unspecified lump in unspecified breast: Secondary | ICD-10-CM | POA: Insufficient documentation

## 2022-07-31 DIAGNOSIS — R922 Inconclusive mammogram: Secondary | ICD-10-CM | POA: Insufficient documentation

## 2022-09-25 ENCOUNTER — Inpatient Hospital Stay: Payer: Medicare Other

## 2022-09-25 ENCOUNTER — Encounter: Payer: Self-pay | Admitting: Internal Medicine

## 2022-09-25 ENCOUNTER — Inpatient Hospital Stay: Payer: Medicare Other | Attending: Internal Medicine | Admitting: Internal Medicine

## 2022-09-25 VITALS — BP 153/55 | HR 63 | Temp 97.8°F | Resp 18 | Wt 289.0 lb

## 2022-09-25 DIAGNOSIS — D649 Anemia, unspecified: Secondary | ICD-10-CM

## 2022-09-25 DIAGNOSIS — D509 Iron deficiency anemia, unspecified: Secondary | ICD-10-CM | POA: Diagnosis not present

## 2022-09-25 DIAGNOSIS — I2510 Atherosclerotic heart disease of native coronary artery without angina pectoris: Secondary | ICD-10-CM | POA: Insufficient documentation

## 2022-09-25 DIAGNOSIS — E119 Type 2 diabetes mellitus without complications: Secondary | ICD-10-CM | POA: Diagnosis not present

## 2022-09-25 DIAGNOSIS — Z87891 Personal history of nicotine dependence: Secondary | ICD-10-CM | POA: Diagnosis not present

## 2022-09-25 DIAGNOSIS — I10 Essential (primary) hypertension: Secondary | ICD-10-CM | POA: Diagnosis not present

## 2022-09-25 DIAGNOSIS — G4733 Obstructive sleep apnea (adult) (pediatric): Secondary | ICD-10-CM | POA: Diagnosis not present

## 2022-09-25 DIAGNOSIS — Z7985 Long-term (current) use of injectable non-insulin antidiabetic drugs: Secondary | ICD-10-CM | POA: Insufficient documentation

## 2022-09-25 DIAGNOSIS — Z79899 Other long term (current) drug therapy: Secondary | ICD-10-CM | POA: Diagnosis not present

## 2022-09-25 DIAGNOSIS — R718 Other abnormality of red blood cells: Secondary | ICD-10-CM | POA: Insufficient documentation

## 2022-09-25 DIAGNOSIS — Z7982 Long term (current) use of aspirin: Secondary | ICD-10-CM | POA: Insufficient documentation

## 2022-09-25 DIAGNOSIS — Z794 Long term (current) use of insulin: Secondary | ICD-10-CM | POA: Insufficient documentation

## 2022-09-25 LAB — IRON AND TIBC
Iron: 57 ug/dL (ref 28–170)
Saturation Ratios: 22 % (ref 10.4–31.8)
TIBC: 263 ug/dL (ref 250–450)
UIBC: 206 ug/dL

## 2022-09-25 LAB — VITAMIN B12: Vitamin B-12: 141 pg/mL — ABNORMAL LOW (ref 180–914)

## 2022-09-25 LAB — FOLATE: Folate: 9.6 ng/mL (ref >5.9)

## 2022-09-25 LAB — FERRITIN: Ferritin: 434 ng/mL — ABNORMAL HIGH (ref 11–307)

## 2022-09-25 NOTE — Progress Notes (Signed)
Patient here for initial oncology appointment, concerns of SOB and fatigue

## 2022-09-25 NOTE — Progress Notes (Signed)
[facility]  [phone] Fax:([phone]) [phone]  ID: LARISA LANIUS OB: Jan 19, 1956  MR#: [id number]  VEH#:[id number]  [name], MD as PCP - General (Family Medicine)  REFERRING PROVIDER: Dr. Angie Fava  REASON FOR REFERRAL: anemia  HPI: Julie Sawyer is a 66 y.o. female with past medical history of hypertension, CAD, diabetes, anemia, OSA on CPAP was referred to hematology for work-up of anemia.  Patient reports longstanding history of anemia.  On lab review, dating back to 2014 patient baseline hemoglobin has been between 9-10.  With MCV of 69-71.  Platelets are normal and WBC normal.  Patient reports diagnosis of thalassemia in her son.  Patient denies fever, chills, nausea, vomiting, shortness of breath, cough, abdominal pain, bleeding, bowel or bladder issues. Energy level is good.  Appetite is good.  Denies any weight loss.  Denies bleeding   REVIEW OF SYSTEMS:   ROS  As per HPI. Otherwise, a complete review of systems is negative.  PAST MEDICAL HISTORY: Past Medical History:  Diagnosis Date   Anemia    Arthritis    Bursitis    Coronary artery disease    Diabetes mellitus without complication (HCC)    Elevated lipids    Gout    Hypertension    Neuropathy    Obesity    Shortness of breath dyspnea    Sleep apnea    use CPAP   Vaginal bleeding     PAST SURGICAL HISTORY: Past Surgical History:  Procedure Laterality Date   BREAST BIOPSY Left    neg   BREAST SURGERY Left    CATARACT EXTRACTION W/PHACO Right 06/03/2020   Procedure: CATARACT EXTRACTION PHACO AND INTRAOCULAR LENS PLACEMENT (IOC) RIGHT DIABETIC;  Surgeon: Elliot Cousin, MD;  Location: [facility];  Service: Ophthalmology;  Laterality: Right;  Lot # [id number] H US;00:20.0 CDE: 2.69   CATARACT EXTRACTION W/PHACO Left 07/23/2020   Procedure: CATARACT EXTRACTION PHACO AND INTRAOCULAR LENS PLACEMENT (IOC) LEFT VISION BLUE DIABETIC;  Surgeon: Elliot Cousin, MD;   Location: [facility];  Service: Ophthalmology;  Laterality: Left;  [place] 00:17.5 CDE 2.97 AP% 8.2 Fluid Pack Lot # [id number]   CESAREAN SECTION  1989   DILATATION & CURETTAGE/HYSTEROSCOPY WITH MYOSURE N/A 08/28/2017   Procedure: DILATATION & CURETTAGE/HYSTEROSCOPY WITH MYOSURE;  Surgeon: Conard Novak, MD;  Location: [facility];  Service: Gynecology;  Laterality: N/A;   DILATION AND CURETTAGE OF UTERUS     ENDOMETRIAL ABLATION N/A 08/28/2017   Procedure: ENDOMETRIAL POLYPECTOMY;  Surgeon: Conard Novak, MD;  Location: [facility];  Service: Gynecology;  Laterality: N/A;   TONSILLECTOMY     TUBAL LIGATION      FAMILY HISTORY: Family History  Adopted: Yes    HEALTH MAINTENANCE: Social History   Tobacco Use   Smoking status: Former    Packs/day: 0.75    Years: 20.00    Total pack years: 15.00    Types: Cigarettes    Quit date: 11/13/1997    Years since quitting: 24.8   Smokeless tobacco: Never  Vaping Use   Vaping Use: Never used  Substance Use Topics   Alcohol use: No   Drug use: No     Allergies  Allergen Reactions   Penicillins Other (See Comments)    Childhood reaction.    Current Outpatient Medications  Medication Sig Dispense Refill   aspirin EC 81 MG tablet Take 81 mg by mouth daily. Swallow whole.     carvedilol (COREG) 6.25 MG tablet Take  6.25 mg by mouth 2 (two) times daily.     cloNIDine (CATAPRES) 0.1 MG tablet Take 0.1 mg by mouth 2 (two) times daily.     furosemide (LASIX) 20 MG tablet Take 20 mg by mouth 2 (two) times daily.  0   gabapentin (NEURONTIN) 300 MG capsule Take 300 mg by mouth 2 (two) times daily.      hydrALAZINE (APRESOLINE) 100 MG tablet Take 100-200 mg by mouth See admin instructions. Take 2 tablets (200 mg) by mouth in the morning & take 1 tablet (100 mg) by mouth in the afternoon.     indomethacin (INDOCIN) 50 MG capsule Take 50 mg by mouth 2 (two) times daily as needed for moderate pain (pain (gout)).   0   insulin detemir (LEVEMIR) 100  UNIT/ML injection Inject 60 Units into the skin 2 (two) times daily.      LEVEMIR FLEXTOUCH 100 UNIT/ML FlexPen      levocetirizine (XYZAL) 5 MG tablet Take 1 tablet by mouth daily.     liraglutide (VICTOZA) 18 MG/3ML SOPN Inject 1.2 mg into the skin daily.     losartan (COZAAR) 100 MG tablet Take 100 mg by mouth daily.     pentoxifylline (TRENTAL) 400 MG CR tablet Take 400 mg by mouth daily.     rosuvastatin (CRESTOR) 20 MG tablet Take 20 mg by mouth at bedtime.      Ascorbic Acid (SM CHEWABLE VITAMIN C) 500 MG CHEW Chew 500 mg by mouth daily. (Patient not taking: Reported on 09/25/2022)     Biotin w/ Vitamins C & E (HAIR/SKIN/NAILS PO) Take 2 tablets by mouth daily. Gummy (Patient not taking: Reported on 12/02/2021)     Black Elderberry (SAMBUCUS ELDERBERRY PO) Take 1,250 mg by mouth daily. (Patient not taking: Reported on 12/02/2021)     meloxicam (MOBIC) 15 MG tablet Take 15 mg by mouth daily. (Patient not taking: Reported on 12/02/2021)     SLOW FE 142 (45 Fe) MG TBCR Take 1 tablet by mouth daily. (Patient not taking: Reported on 12/02/2021)     No current facility-administered medications for this visit.    OBJECTIVE: Vitals:   09/25/22 1056  BP: (!) 153/55  Pulse: 63  Resp: 18  Temp: 97.8 F (36.6 C)  SpO2: 99%     Body mass index is 58.37 kg/m.      General: Well-developed, well-nourished, no acute distress. Eyes: Pink conjunctiva, anicteric sclera. HEENT: Normocephalic, moist mucous membranes, clear oropharnyx. Lungs: Clear to auscultation bilaterally. Heart: Regular rate and rhythm. No rubs, murmurs, or gallops. Abdomen: Soft, nontender, nondistended. No organomegaly noted, normoactive bowel sounds. Musculoskeletal: No edema, cyanosis, or clubbing. Neuro: Alert, answering all questions appropriately. Cranial nerves grossly intact. Skin: No rashes or petechiae noted. Psych: Normal affect. Lymphatics: No cervical, calvicular, axillary or inguinal LAD.   LAB  RESULTS:  Lab Results  Component Value Date   NA 144 07/23/2020   K 3.7 07/23/2020   CL 104 07/23/2020   CO2 26 07/31/2017   GLUCOSE 151 (H) 07/23/2020   BUN 24 (H) 07/23/2020   CREATININE 0.80 07/23/2020   CALCIUM 9.1 07/31/2017   PROT 7.6 07/31/2017   ALBUMIN 3.7 07/31/2017   AST 20 07/31/2017   ALT 17 07/31/2017   ALKPHOS 62 07/31/2017   BILITOT 0.3 07/31/2017   GFRNONAA >60 07/31/2017   GFRAA >60 07/31/2017    Lab Results  Component Value Date   WBC 10.3 07/31/2017   HGB 10.9 (L) 07/23/2020   HCT  32.0 (L) 07/23/2020   MCV 69.5 (L) 07/31/2017   PLT 258 07/31/2017    No results found for: "TIBC", "FERRITIN", "IRONPCTSAT"   STUDIES: No results found.  ASSESSMENT AND PLAN:   YAMILEE HARMES is a 66 y.o. female with pmh of hypertension, CAD, diabetes, anemia, OSA on CPAP was referred to hematology for work-up of anemia  #Chronic microcytic anemia -Unknown etiology -On lab review, dating back to 2014 patient baseline hemoglobin has been between 9-10.  With MCV of 69-71.  Platelets are normal and WBC normal.  Patient reports diagnosis of thalassemia in her son. -Work-up as below  Orders Placed This Encounter  Procedures   Iron and TIBC([facility])   Ferritin   Vitamin B12   Folate   Hgb Fractionation [place]   RTC in 3 weeks for MD visit, discuss labs.  Patient expressed understanding and was in agreement with this plan. She also understands that She can call clinic at any time with any questions, concerns, or complaints.   I spent a total of 45 minutes reviewing chart data, face-to-face evaluation with the patient, counseling and coordination of care as detailed above.  [name], MD   09/25/2022 11:58 AM

## 2022-09-27 LAB — HGB FRACTIONATION CASCADE
Hgb A2: 2.1 % (ref 1.8–3.2)
Hgb A: 97.9 % (ref 96.4–98.8)
Hgb F: 0 % (ref 0.0–2.0)
Hgb S: 0 %

## 2022-10-09 ENCOUNTER — Inpatient Hospital Stay (HOSPITAL_BASED_OUTPATIENT_CLINIC_OR_DEPARTMENT_OTHER): Payer: Medicare Other | Admitting: Internal Medicine

## 2022-10-09 ENCOUNTER — Encounter: Payer: Self-pay | Admitting: Internal Medicine

## 2022-10-09 VITALS — BP 186/53 | HR 87 | Temp 98.7°F | Resp 20 | Wt 294.8 lb

## 2022-10-09 DIAGNOSIS — D509 Iron deficiency anemia, unspecified: Secondary | ICD-10-CM | POA: Diagnosis not present

## 2022-10-09 DIAGNOSIS — R718 Other abnormality of red blood cells: Secondary | ICD-10-CM

## 2022-10-09 DIAGNOSIS — D649 Anemia, unspecified: Secondary | ICD-10-CM

## 2022-10-09 DIAGNOSIS — E538 Deficiency of other specified B group vitamins: Secondary | ICD-10-CM | POA: Diagnosis not present

## 2022-10-09 NOTE — Progress Notes (Signed)
Patient has no concerns today. 

## 2022-10-09 NOTE — Progress Notes (Signed)
[facility]  [phone] [phone]  ID: Julie Sawyer OB: [birth year]  MR#: [id number]  ACZ#:[id number]  [name], MD as PCP - General (Family Medicine)  REFERRING PROVIDER: Dr. Angie Fava  REASON FOR REFERRAL: anemia  HPI: Julie Sawyer is a 66 y.o. female with past medical history of hypertension, CAD, diabetes, anemia, OSA on CPAP was referred to hematology for work-up of anemia.  Patient reports longstanding history of anemia.  On lab review, dating back to 2014 patient baseline hemoglobin has been between 9-10.  With MCV of 69-71.  Platelets are normal and WBC normal.  Patient reports diagnosis of thalassemia in her son.  Interval history Patient seen today to discuss lab results. Patient has been feeling well. Patient denies fever, chills, nausea, vomiting, shortness of breath, cough, abdominal pain, bleeding, bowel or bladder issues. Energy level is good.  Appetite is good.  Denies any weight loss. Denies pain.   REVIEW OF SYSTEMS:   ROS  As per HPI. Otherwise, a complete review of systems is negative.  PAST MEDICAL HISTORY: Past Medical History:  Diagnosis Date   Anemia    Arthritis    Bursitis    Coronary artery disease    Diabetes mellitus without complication (HCC)    Elevated lipids    Gout    Hypertension    Neuropathy    Obesity    Shortness of breath dyspnea    Sleep apnea    use CPAP   Vaginal bleeding     PAST SURGICAL HISTORY: Past Surgical History:  Procedure Laterality Date   BREAST BIOPSY Left    neg   BREAST SURGERY Left    CATARACT EXTRACTION W/PHACO Right 06/03/2020   Procedure: CATARACT EXTRACTION PHACO AND INTRAOCULAR LENS PLACEMENT (IOC) RIGHT DIABETIC;  Surgeon: Elliot Cousin, MD;  Location: [facility];  Service: Ophthalmology;  Laterality: Right;  Lot # [id number] H US;00:20.0 CDE: 2.69   CATARACT EXTRACTION W/PHACO Left 07/23/2020   Procedure: CATARACT EXTRACTION PHACO AND  INTRAOCULAR LENS PLACEMENT (IOC) LEFT VISION BLUE DIABETIC;  Surgeon: Elliot Cousin, MD;  Location: [facility];  Service: Ophthalmology;  Laterality: Left;  [place] 00:17.5 CDE 2.97 AP% 8.2 Fluid Pack Lot # [id number]   CESAREAN SECTION  1989   DILATATION & CURETTAGE/HYSTEROSCOPY WITH MYOSURE N/A 08/28/2017   Procedure: DILATATION & CURETTAGE/HYSTEROSCOPY WITH MYOSURE;  Surgeon: Conard Novak, MD;  Location: [facility];  Service: Gynecology;  Laterality: N/A;   DILATION AND CURETTAGE OF UTERUS     ENDOMETRIAL ABLATION N/A 08/28/2017   Procedure: ENDOMETRIAL POLYPECTOMY;  Surgeon: Conard Novak, MD;  Location: [facility];  Service: Gynecology;  Laterality: N/A;   TONSILLECTOMY     TUBAL LIGATION      FAMILY HISTORY: Family History  Adopted: Yes    HEALTH MAINTENANCE: Social History   Tobacco Use   Smoking status: Former    Packs/day: 0.75    Years: 20.00    Total pack years: 15.00    Types: Cigarettes    Quit date: 11/13/1997    Years since quitting: 24.9   Smokeless tobacco: Never  Vaping Use   Vaping Use: Never used  Substance Use Topics   Alcohol use: No   Drug use: No     Allergies  Allergen Reactions   Penicillins Other (See Comments)    Childhood reaction.    Current Outpatient Medications  Medication Sig Dispense Refill   aspirin EC 81 MG tablet Take 81 mg by mouth  daily. Swallow whole.     carvedilol (COREG) 6.25 MG tablet Take 6.25 mg by mouth 2 (two) times daily.     cloNIDine (CATAPRES) 0.1 MG tablet Take 0.1 mg by mouth 2 (two) times daily.     furosemide (LASIX) 20 MG tablet Take 20 mg by mouth 2 (two) times daily.  0   gabapentin (NEURONTIN) 300 MG capsule Take 300 mg by mouth 2 (two) times daily.      hydrALAZINE (APRESOLINE) 100 MG tablet Take 100-200 mg by mouth See admin instructions. Take 2 tablets (200 mg) by mouth in the morning & take 1 tablet (100 mg) by mouth in the afternoon.     indomethacin (INDOCIN) 50 MG capsule Take 50 mg by mouth 2 (two)  times daily as needed for moderate pain (pain (gout)).   0   insulin detemir (LEVEMIR) 100 UNIT/ML injection Inject 60 Units into the skin 2 (two) times daily.      LEVEMIR FLEXTOUCH 100 UNIT/ML FlexPen      levocetirizine (XYZAL) 5 MG tablet Take 1 tablet by mouth daily.     liraglutide (VICTOZA) 18 MG/3ML SOPN Inject 1.2 mg into the skin daily.     losartan (COZAAR) 100 MG tablet Take 100 mg by mouth daily.     pentoxifylline (TRENTAL) 400 MG CR tablet Take 400 mg by mouth daily.     rosuvastatin (CRESTOR) 20 MG tablet Take 20 mg by mouth at bedtime.      Ascorbic Acid (SM CHEWABLE VITAMIN C) 500 MG CHEW Chew 500 mg by mouth daily. (Patient not taking: Reported on 09/25/2022)     Biotin w/ Vitamins C & E (HAIR/SKIN/NAILS PO) Take 2 tablets by mouth daily. Gummy (Patient not taking: Reported on 12/02/2021)     Black Elderberry (SAMBUCUS ELDERBERRY PO) Take 1,250 mg by mouth daily. (Patient not taking: Reported on 12/02/2021)     meloxicam (MOBIC) 15 MG tablet Take 15 mg by mouth daily. (Patient not taking: Reported on 12/02/2021)     SLOW FE 142 (45 Fe) MG TBCR Take 1 tablet by mouth daily. (Patient not taking: Reported on 12/02/2021)     No current facility-administered medications for this visit.    OBJECTIVE: There were no vitals filed for this visit.    Body mass index is 59.54 kg/m.      General: Well-developed, well-nourished, no acute distress. Eyes: Pink conjunctiva, anicteric sclera. HEENT: Normocephalic, moist mucous membranes, clear oropharnyx. Lungs: Clear to auscultation bilaterally. Heart: Regular rate and rhythm. No rubs, murmurs, or gallops. Abdomen: Soft, nontender, nondistended. No organomegaly noted, normoactive bowel sounds. Musculoskeletal: No edema, cyanosis, or clubbing. Neuro: Alert, answering all questions appropriately. Cranial nerves grossly intact. Skin: No rashes or petechiae noted. Psych: Normal affect. Lymphatics: No cervical, calvicular, axillary or  inguinal LAD.   LAB RESULTS:  Lab Results  Component Value Date   NA 144 07/23/2020   K 3.7 07/23/2020   CL 104 07/23/2020   CO2 26 07/31/2017   GLUCOSE 151 (H) 07/23/2020   BUN 24 (H) 07/23/2020   CREATININE 0.80 07/23/2020   CALCIUM 9.1 07/31/2017   PROT 7.6 07/31/2017   ALBUMIN 3.7 07/31/2017   AST 20 07/31/2017   ALT 17 07/31/2017   ALKPHOS 62 07/31/2017   BILITOT 0.3 07/31/2017   GFRNONAA >60 07/31/2017   GFRAA >60 07/31/2017    Lab Results  Component Value Date   WBC 10.3 07/31/2017   HGB 10.9 (L) 07/23/2020   HCT 32.0 (L) 07/23/2020  MCV 69.5 (L) 07/31/2017   PLT 258 07/31/2017    Lab Results  Component Value Date   TIBC 263 09/25/2022   FERRITIN 434 (H) 09/25/2022   IRONPCTSAT 22 09/25/2022     STUDIES: No results found.  ASSESSMENT AND PLAN:   Julie Sawyer is a 66 y.o. female with pmh of hypertension, CAD, diabetes, anemia, OSA on CPAP was referred to hematology for work-up of anemia  #Chronic microcytic anemia - ?undiagnosed hemoglobinopathy -On lab review, dating back to 2014 patient baseline hemoglobin has been between 9-10.  With MCV of 69-71.  Never required blood transfusion.  Platelets are normal and WBC normal.  Patient reports diagnosis of thalassemia in her son. Does not know type. Iron panel normal. -Hemoglobin electrophoresis is normal.  Would not detect alpha thalassemia.  Discussed with the patient about possible hemoglobinopathy and should she be interested in further testing with genotyping.  Patient will think about it and will let me know in next visit.  # Severe B12 deficiency -B12 from 09/26/2022 141. -Will proceed with IM B12 injection weekly x 4 then maintenance every 3 months.  She will also start MVI once a day.  RTC in 3 months for MD visit, labs, b12 inj. Schedule b12 weekly x 4   Patient expressed understanding and was in agreement with this plan. She also understands that She can call clinic at any time with any  questions, concerns, or complaints.   I spent a total of 30 minutes reviewing chart data, face-to-face evaluation with the patient, counseling and coordination of care as detailed above.  [name], MD   10/09/2022 1:35 PM

## 2022-10-16 ENCOUNTER — Inpatient Hospital Stay: Payer: Medicare Other | Attending: Internal Medicine

## 2022-10-16 DIAGNOSIS — D509 Iron deficiency anemia, unspecified: Secondary | ICD-10-CM | POA: Insufficient documentation

## 2022-10-16 DIAGNOSIS — E538 Deficiency of other specified B group vitamins: Secondary | ICD-10-CM | POA: Diagnosis present

## 2022-10-16 DIAGNOSIS — D649 Anemia, unspecified: Secondary | ICD-10-CM

## 2022-10-16 MED ORDER — CYANOCOBALAMIN 1000 MCG/ML IJ SOLN
1000.0000 ug | Freq: Once | INTRAMUSCULAR | Status: AC
  Administered 2022-10-16: 1000 ug via INTRAMUSCULAR
  Filled 2022-10-16: qty 1

## 2022-10-23 ENCOUNTER — Inpatient Hospital Stay: Payer: Medicare Other

## 2022-10-23 DIAGNOSIS — E538 Deficiency of other specified B group vitamins: Secondary | ICD-10-CM | POA: Diagnosis not present

## 2022-10-23 DIAGNOSIS — D649 Anemia, unspecified: Secondary | ICD-10-CM

## 2022-10-23 MED ORDER — CYANOCOBALAMIN 1000 MCG/ML IJ SOLN
1000.0000 ug | Freq: Once | INTRAMUSCULAR | Status: AC
  Administered 2022-10-23: 1000 ug via INTRAMUSCULAR
  Filled 2022-10-23: qty 1

## 2022-10-30 ENCOUNTER — Inpatient Hospital Stay: Payer: Medicare Other

## 2022-11-07 ENCOUNTER — Inpatient Hospital Stay: Payer: Medicare Other

## 2022-11-07 DIAGNOSIS — E538 Deficiency of other specified B group vitamins: Secondary | ICD-10-CM | POA: Diagnosis not present

## 2022-11-07 DIAGNOSIS — D649 Anemia, unspecified: Secondary | ICD-10-CM

## 2022-11-07 MED ORDER — CYANOCOBALAMIN 1000 MCG/ML IJ SOLN
1000.0000 ug | Freq: Once | INTRAMUSCULAR | Status: AC
  Administered 2022-11-07: 1000 ug via INTRAMUSCULAR
  Filled 2022-11-07: qty 1

## 2022-11-14 ENCOUNTER — Inpatient Hospital Stay: Payer: Medicare Other | Attending: Internal Medicine

## 2022-11-14 DIAGNOSIS — E538 Deficiency of other specified B group vitamins: Secondary | ICD-10-CM | POA: Insufficient documentation

## 2022-11-14 DIAGNOSIS — D509 Iron deficiency anemia, unspecified: Secondary | ICD-10-CM | POA: Diagnosis present

## 2022-11-14 DIAGNOSIS — D649 Anemia, unspecified: Secondary | ICD-10-CM

## 2022-11-14 MED ORDER — CYANOCOBALAMIN 1000 MCG/ML IJ SOLN
1000.0000 ug | Freq: Once | INTRAMUSCULAR | Status: AC
  Administered 2022-11-14: 1000 ug via INTRAMUSCULAR
  Filled 2022-11-14: qty 1

## 2022-11-23 ENCOUNTER — Other Ambulatory Visit (INDEPENDENT_AMBULATORY_CARE_PROVIDER_SITE_OTHER): Payer: Self-pay | Admitting: Nurse Practitioner

## 2022-11-23 DIAGNOSIS — I739 Peripheral vascular disease, unspecified: Secondary | ICD-10-CM

## 2022-12-04 ENCOUNTER — Ambulatory Visit (INDEPENDENT_AMBULATORY_CARE_PROVIDER_SITE_OTHER): Payer: Medicare Other

## 2022-12-04 ENCOUNTER — Ambulatory Visit (INDEPENDENT_AMBULATORY_CARE_PROVIDER_SITE_OTHER): Payer: Medicare Other | Admitting: Nurse Practitioner

## 2022-12-04 ENCOUNTER — Encounter (INDEPENDENT_AMBULATORY_CARE_PROVIDER_SITE_OTHER): Payer: Self-pay | Admitting: Nurse Practitioner

## 2022-12-04 VITALS — BP 143/69 | HR 79 | Resp 16 | Wt 286.4 lb

## 2022-12-04 DIAGNOSIS — I739 Peripheral vascular disease, unspecified: Secondary | ICD-10-CM

## 2022-12-04 DIAGNOSIS — I77811 Abdominal aortic ectasia: Secondary | ICD-10-CM | POA: Diagnosis not present

## 2022-12-04 DIAGNOSIS — I10 Essential (primary) hypertension: Secondary | ICD-10-CM

## 2022-12-04 DIAGNOSIS — R609 Edema, unspecified: Secondary | ICD-10-CM

## 2022-12-04 NOTE — Progress Notes (Signed)
Subjective:    Patient ID: Julie Sawyer, female    DOB: [birth year], 67 y.o.   MRN: [id number] Chief Complaint  Patient presents with   Follow-up    Ultrasound follow up    Julie Sawyer is a 67 year old female that returns today for follow-up evaluation of peripheral vascular disease.  The patient notes that her swelling is better controlled . She has continued with water aerobics and this has been helpful in controlling her edema, however her husband recently passed and she has not been as active.  She endorses having some claudication-like symptoms in the left lower extremity that is typically only present when she has been climbing up stairs, however she does have some noted significant arthritis.  She denies any rest pain or ulcerations.    Today noninvasive studies show an ABI of 1.02 on the right and 1.05 on the left.  She has normal TBI's with good toe waveforms and good triphasic waveforms bilaterally.  The patient also had an aortoiliac duplex with improved velocities in the bilateral iliac arteries and a 3 cm ectatic aorta.    Review of Systems  Cardiovascular:  Positive for leg swelling.  Musculoskeletal:  Positive for arthralgias and gait problem.  All other systems reviewed and are negative.      Objective:   Physical Exam Vitals reviewed.  HENT:     Head: Normocephalic.  Cardiovascular:     Rate and Rhythm: Normal rate.     Pulses:          Dorsalis pedis pulses are 1+ on the right side and 1+ on the left side.  Pulmonary:     Effort: Pulmonary effort is normal.  Musculoskeletal:     Right lower leg: 1+ Edema present.     Left lower leg: 1+ Edema present.  Neurological:     Mental Status: She is alert and oriented to person, place, and time.  Psychiatric:        Mood and Affect: Mood normal.        Behavior: Behavior normal.        Thought Content: Thought content normal.        Judgment: Judgment normal.     BP (!) 143/69 (BP Location: Left Arm)    Pulse 79   Resp 16   Wt 286 lb 6.4 oz (129.9 kg)   BMI 57.85 kg/m   Past Medical History:  Diagnosis Date   Anemia    Arthritis    Bursitis    Coronary artery disease    Diabetes mellitus without complication (HCC)    Elevated lipids    Gout    Hypertension    Neuropathy    Obesity    Shortness of breath dyspnea    Sleep apnea    use CPAP   Vaginal bleeding     Social History   Socioeconomic History   Marital status: Married    Spouse name: Not on file   Number of children: Not on file   Years of education: Not on file   Highest education level: Not on file  Occupational History   Not on file  Tobacco Use   Smoking status: Former    Packs/day: 0.75    Years: 20.00    Total pack years: 15.00    Types: Cigarettes    Quit date: 11/13/1997    Years since quitting: 25.0   Smokeless tobacco: Never  Vaping Use   Vaping Use: Never used  Substance and  Sexual Activity   Alcohol use: No   Drug use: No   Sexual activity: Not on file  Other Topics Concern   Not on file  Social History Narrative   Not on file   Social Determinants of Health   Financial Resource Strain: Not on file  Food Insecurity: Not on file  Transportation Needs: Not on file  Physical Activity: Not on file  Stress: Not on file  Social Connections: Not on file  Intimate Partner Violence: Not on file    Past Surgical History:  Procedure Laterality Date   BREAST BIOPSY Left    neg   BREAST SURGERY Left    CATARACT EXTRACTION W/PHACO Right 06/03/2020   Procedure: CATARACT EXTRACTION PHACO AND INTRAOCULAR LENS PLACEMENT (IOC) RIGHT DIABETIC;  Surgeon: Elliot Cousin, MD;  Location: [facility];  Service: Ophthalmology;  Laterality: Right;  Lot # [id number] H US;00:20.0 CDE: 2.69   CATARACT EXTRACTION W/PHACO Left 07/23/2020   Procedure: CATARACT EXTRACTION PHACO AND INTRAOCULAR LENS PLACEMENT (IOC) LEFT VISION BLUE DIABETIC;  Surgeon: Elliot Cousin, MD;  Location: [facility];  Service: Ophthalmology;   Laterality: Left;  [place] 00:17.5 CDE 2.97 AP% 8.2 Fluid Pack Lot # [id number]   CESAREAN SECTION  1989   DILATATION & CURETTAGE/HYSTEROSCOPY WITH MYOSURE N/A 08/28/2017   Procedure: DILATATION & CURETTAGE/HYSTEROSCOPY WITH MYOSURE;  Surgeon: Conard Novak, MD;  Location: [facility];  Service: Gynecology;  Laterality: N/A;   DILATION AND CURETTAGE OF UTERUS     ENDOMETRIAL ABLATION N/A 08/28/2017   Procedure: ENDOMETRIAL POLYPECTOMY;  Surgeon: Conard Novak, MD;  Location: [facility];  Service: Gynecology;  Laterality: N/A;   TONSILLECTOMY     TUBAL LIGATION      Family History  Adopted: Yes    Allergies  Allergen Reactions   Penicillins Other (See Comments)    Childhood reaction.       Latest Ref Rng & Units 07/23/2020    7:35 AM 07/31/2017    9:54 AM 01/04/2017    8:53 AM  CBC  WBC 3.6 - 11.0 K/uL  10.3  10.7   Hemoglobin 12.0 - 15.0 g/dL 34.7  42.5  9.8   Hematocrit 36.0 - 46.0 % 32.0  33.0  30.2   Platelets 150 - 440 K/uL  258  282       CMP     Component Value Date/Time   NA 144 07/23/2020 0735   NA 138 03/17/2013 1001   K 3.7 07/23/2020 0735   K 3.6 03/17/2013 1001   CL 104 07/23/2020 0735   CL 104 03/17/2013 1001   CO2 26 07/31/2017 0954   CO2 27 03/17/2013 1001   GLUCOSE 151 (H) 07/23/2020 0735   GLUCOSE 104 (H) 03/17/2013 1001   BUN 24 (H) 07/23/2020 0735   BUN 20 (H) 03/17/2013 1001   CREATININE 0.80 07/23/2020 0735   CREATININE 0.95 03/17/2013 1001   CALCIUM 9.1 07/31/2017 0954   CALCIUM 9.5 03/17/2013 1001   PROT 7.6 07/31/2017 0954   ALBUMIN 3.7 07/31/2017 0954   AST 20 07/31/2017 0954   ALT 17 07/31/2017 0954   ALKPHOS 62 07/31/2017 0954   BILITOT 0.3 07/31/2017 0954   GFRNONAA >60 07/31/2017 0954   GFRNONAA >60 03/17/2013 1001   GFRAA >60 07/31/2017 0954   GFRAA >60 03/17/2013 1001     No results found.     Assessment & Plan:   1. PVD (peripheral vascular disease) (HCC) Elevated velocities noted in the iliac arteries are improved  from last  year but she has previously had noted greater than 50% stenoses.  However since the patient is not having significant claudication-like symptoms or rest pain we will continue with close follow-up.  The patient is advised that if she begins to have claudication symptoms that occur regularly and begin to prohibit her life she should contact us sooner.  Otherwise we will have the patient return in 1 year for noninvasive studies  2. Hypertension, unspecified type Continue antihypertensive medications as already ordered, these medications have been reviewed and there are no changes at this time.   3. Edema, unspecified type The edema is much improved.  Patient is encouraged to continue with water aerobics.  In addition to use of conservative therapy and elevation.  We will continue to follow annually.  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  Typically ectatic aorta is or not at risk for rupture but they can develop into aortic aneurysms.  Because we are aware we will continue to follow this annually.   Current Outpatient Medications on File Prior to Visit  Medication Sig Dispense Refill   aspirin EC 81 MG tablet Take 81 mg by mouth daily. Swallow whole.     cloNIDine (CATAPRES) 0.1 MG tablet Take 0.1 mg by mouth 2 (two) times daily.     furosemide (LASIX) 20 MG tablet Take 20 mg by mouth 2 (two) times daily.  0   gabapentin (NEURONTIN) 300 MG capsule Take 300 mg by mouth 2 (two) times daily.      hydrALAZINE (APRESOLINE) 100 MG tablet Take 100-200 mg by mouth See admin instructions. Take 2 tablets (200 mg) by mouth in the morning & take 1 tablet (100 mg) by mouth in the afternoon.     LEVEMIR FLEXTOUCH 100 UNIT/ML FlexPen 60 Units 2 (two) times daily.     levocetirizine (XYZAL) 5 MG tablet Take 1 tablet by mouth daily.     liraglutide (VICTOZA) 18 MG/3ML SOPN Inject 1.2 mg into the  skin daily.     losartan (COZAAR) 100 MG tablet Take 100 mg by mouth daily.     pentoxifylline (TRENTAL) 400 MG CR tablet Take 400 mg by mouth daily.     rosuvastatin (CRESTOR) 20 MG tablet Take 20 mg by mouth at bedtime.      Ascorbic Acid (SM CHEWABLE VITAMIN C) 500 MG CHEW Chew 500 mg by mouth daily. (Patient not taking: Reported on 09/25/2022)     Biotin w/ Vitamins C & E (HAIR/SKIN/NAILS PO) Take 2 tablets by mouth daily. Gummy (Patient not taking: Reported on 12/02/2021)     Black Elderberry (SAMBUCUS ELDERBERRY PO) Take 1,250 mg by mouth daily. (Patient not taking: Reported on 12/02/2021)     carvedilol (COREG) 6.25 MG tablet Take 6.25 mg by mouth 2 (two) times daily. (Patient not taking: Reported on 12/04/2022)     indomethacin (INDOCIN) 50 MG capsule Take 50 mg by mouth 2 (two) times daily as needed for moderate pain (pain (gout)).  (Patient not taking: Reported on 12/04/2022)  0   insulin detemir (LEVEMIR) 100 UNIT/ML injection Inject 60 Units into the skin 2 (two) times daily.  (Patient not taking: Reported on 12/04/2022)     meloxicam (MOBIC) 15 MG tablet Take 15 mg by mouth daily. (Patient not taking: Reported on 12/02/2021)     SLOW FE 142 (45 Fe) MG TBCR Take 1 tablet by mouth daily. (Patient  not taking: Reported on 12/02/2021)     No current facility-administered medications on file prior to visit.    There are no Patient Instructions on file for this visit. No follow-ups on file.   Kris Hartmann, NP

## 2022-12-11 LAB — VAS US ABI WITH/WO TBI
Left ABI: 1.05
Right ABI: 1.02

## 2022-12-28 ENCOUNTER — Other Ambulatory Visit: Payer: Self-pay | Admitting: Cardiovascular Disease

## 2022-12-28 DIAGNOSIS — I10 Essential (primary) hypertension: Secondary | ICD-10-CM

## 2022-12-28 DIAGNOSIS — E782 Mixed hyperlipidemia: Secondary | ICD-10-CM

## 2023-01-01 ENCOUNTER — Other Ambulatory Visit: Payer: Self-pay

## 2023-01-03 ENCOUNTER — Other Ambulatory Visit: Payer: Self-pay

## 2023-01-03 DIAGNOSIS — E782 Mixed hyperlipidemia: Secondary | ICD-10-CM

## 2023-01-03 DIAGNOSIS — I10 Essential (primary) hypertension: Secondary | ICD-10-CM

## 2023-01-03 MED ORDER — HYDRALAZINE HCL 100 MG PO TABS: 100.0000 mg | ORAL_TABLET | Freq: Three times a day (TID) | ORAL | 1 refills | Status: DC

## 2023-01-03 MED ORDER — ROSUVASTATIN CALCIUM 20 MG PO TABS: 20.0000 mg | ORAL_TABLET | Freq: Every day | ORAL | 1 refills | Status: DC

## 2023-01-09 ENCOUNTER — Inpatient Hospital Stay: Payer: Medicare Other

## 2023-01-09 ENCOUNTER — Inpatient Hospital Stay: Payer: Medicare Other | Attending: Internal Medicine | Admitting: Internal Medicine

## 2023-01-09 VITALS — BP 161/66 | HR 76 | Temp 97.3°F | Ht 59.0 in | Wt 297.0 lb

## 2023-01-09 DIAGNOSIS — I2510 Atherosclerotic heart disease of native coronary artery without angina pectoris: Secondary | ICD-10-CM | POA: Insufficient documentation

## 2023-01-09 DIAGNOSIS — Z7985 Long-term (current) use of injectable non-insulin antidiabetic drugs: Secondary | ICD-10-CM | POA: Insufficient documentation

## 2023-01-09 DIAGNOSIS — Z87891 Personal history of nicotine dependence: Secondary | ICD-10-CM | POA: Diagnosis not present

## 2023-01-09 DIAGNOSIS — E538 Deficiency of other specified B group vitamins: Secondary | ICD-10-CM

## 2023-01-09 DIAGNOSIS — D509 Iron deficiency anemia, unspecified: Secondary | ICD-10-CM | POA: Insufficient documentation

## 2023-01-09 DIAGNOSIS — Z794 Long term (current) use of insulin: Secondary | ICD-10-CM | POA: Insufficient documentation

## 2023-01-09 DIAGNOSIS — E119 Type 2 diabetes mellitus without complications: Secondary | ICD-10-CM | POA: Diagnosis not present

## 2023-01-09 DIAGNOSIS — D649 Anemia, unspecified: Secondary | ICD-10-CM

## 2023-01-09 DIAGNOSIS — R718 Other abnormality of red blood cells: Secondary | ICD-10-CM

## 2023-01-09 DIAGNOSIS — Z79899 Other long term (current) drug therapy: Secondary | ICD-10-CM | POA: Diagnosis not present

## 2023-01-09 DIAGNOSIS — I10 Essential (primary) hypertension: Secondary | ICD-10-CM | POA: Diagnosis not present

## 2023-01-09 DIAGNOSIS — Z7982 Long term (current) use of aspirin: Secondary | ICD-10-CM | POA: Insufficient documentation

## 2023-01-09 LAB — CBC WITH DIFFERENTIAL/PLATELET
Abs Immature Granulocytes: 0.07 10*3/uL (ref 0.00–0.07)
Basophils Absolute: 0.1 10*3/uL (ref 0.0–0.1)
Basophils Relative: 1 %
Eosinophils Absolute: 0.4 10*3/uL (ref 0.0–0.5)
Eosinophils Relative: 4 %
HCT: 32.3 % — ABNORMAL LOW (ref 36.0–46.0)
Hemoglobin: 9.6 g/dL — ABNORMAL LOW (ref 12.0–15.0)
Immature Granulocytes: 1 %
Lymphocytes Relative: 29 %
Lymphs Abs: 3.2 10*3/uL (ref 0.7–4.0)
MCH: 21.6 pg — ABNORMAL LOW (ref 26.0–34.0)
MCHC: 29.7 g/dL — ABNORMAL LOW (ref 30.0–36.0)
MCV: 72.6 fL — ABNORMAL LOW (ref 80.0–100.0)
Monocytes Absolute: 0.8 10*3/uL (ref 0.1–1.0)
Monocytes Relative: 7 %
Neutro Abs: 6.4 10*3/uL (ref 1.7–7.7)
Neutrophils Relative %: 58 %
Platelets: 302 10*3/uL (ref 150–400)
RBC: 4.45 MIL/uL (ref 3.87–5.11)
RDW: 18.2 % — ABNORMAL HIGH (ref 11.5–15.5)
WBC: 11 10*3/uL — ABNORMAL HIGH (ref 4.0–10.5)
nRBC: 0 % (ref 0.0–0.2)

## 2023-01-09 LAB — VITAMIN B12: Vitamin B-12: >7500 pg/mL — ABNORMAL HIGH (ref 180–914)

## 2023-01-09 MED ORDER — CYANOCOBALAMIN 1000 MCG/ML IJ SOLN
1000.0000 ug | Freq: Once | INTRAMUSCULAR | Status: AC
  Administered 2023-01-09: 1000 ug via INTRAMUSCULAR
  Filled 2023-01-09: qty 1

## 2023-01-09 NOTE — Progress Notes (Signed)
[place]  [phone] [phone]  ID: Julie Sawyer OB: 1956/06/10  MR#: [id number]  [id number]  Patient Care Team: Donnie Coffin, MD as PCP - General (Family Medicine)  REFERRING PROVIDER: Dr. Winfield Rast  REASON FOR REFERRAL: anemia  HPI: Julie Sawyer is a 67 y.o. female with past medical history of hypertension, CAD, diabetes, anemia, OSA on CPAP was referred to hematology for work-up of anemia.  Patient reports longstanding history of anemia.  On lab review, dating back to 2014 patient baseline hemoglobin has been between 9-10.  With MCV of 69-71.  Platelets are normal and WBC normal.  Patient reports diagnosis of thalassemia in her son.  Interval history Patient seen today to discuss labs and for B12 injection. Patient received B12 injection weekly x 4.  Tolerated well.  Was in the wheelchair today due to worsening knee pain she has history of arthritis and it gets worse with the cold and rainy weather.  Otherwise denies any concerns.   REVIEW OF SYSTEMS:   ROS  As per HPI. Otherwise, a complete review of systems is negative.  PAST MEDICAL HISTORY: Past Medical History:  Diagnosis Date   Anemia    Arthritis    Bursitis    Coronary artery disease    Diabetes mellitus without complication (HCC)    Elevated lipids    Gout    Hypertension    Neuropathy    Obesity    Shortness of breath dyspnea    Sleep apnea    use CPAP   Vaginal bleeding     PAST SURGICAL HISTORY: Past Surgical History:  Procedure Laterality Date   BREAST BIOPSY Left    neg   BREAST SURGERY Left    CATARACT EXTRACTION W/PHACO Right 06/03/2020   Procedure: CATARACT EXTRACTION PHACO AND INTRAOCULAR LENS PLACEMENT ([place]) RIGHT DIABETIC;  Surgeon: Marchia Meiers, MD;  Location: [facility];  Service: Ophthalmology;  Laterality: Right;  Lot # [id number] H US;00:20.0 CDE: 2.69   CATARACT EXTRACTION W/PHACO Left 07/23/2020   Procedure: CATARACT EXTRACTION PHACO  AND INTRAOCULAR LENS PLACEMENT ([place]) LEFT VISION BLUE DIABETIC;  Surgeon: Marchia Meiers, MD;  Location: [facility];  Service: Ophthalmology;  Laterality: Left;  [place] 00:17.5 CDE 2.97 AP% 8.2 Fluid Pack Lot # [id number]   CESAREAN SECTION  1989   DILATATION & CURETTAGE/HYSTEROSCOPY WITH MYOSURE N/A 08/28/2017   Procedure: DILATATION & CURETTAGE/HYSTEROSCOPY WITH MYOSURE;  Surgeon: Will Bonnet, MD;  Location: [facility];  Service: Gynecology;  Laterality: N/A;   DILATION AND CURETTAGE OF UTERUS     ENDOMETRIAL ABLATION N/A 08/28/2017   Procedure: ENDOMETRIAL POLYPECTOMY;  Surgeon: Will Bonnet, MD;  Location: [facility];  Service: Gynecology;  Laterality: N/A;   TONSILLECTOMY     TUBAL LIGATION      FAMILY HISTORY: Family History  Adopted: Yes    HEALTH MAINTENANCE: Social History   Tobacco Use   Smoking status: Former    Packs/day: 0.75    Years: 20.00    Total pack years: 15.00    Types: Cigarettes    Quit date: 11/13/1997    Years since quitting: 25.1   Smokeless tobacco: Never  Vaping Use   Vaping Use: Never used  Substance Use Topics   Alcohol use: No   Drug use: No     Allergies  Allergen Reactions   Penicillins Other (See Comments)    Childhood reaction.    Current Outpatient Medications  Medication Sig Dispense Refill   aspirin EC 81  MG tablet Take 81 mg by mouth daily. Swallow whole.     carvedilol (COREG) 6.25 MG tablet Take 6.25 mg by mouth 2 (two) times daily.     cloNIDine (CATAPRES) 0.1 MG tablet Take 0.1 mg by mouth 2 (two) times daily.     furosemide (LASIX) 20 MG tablet Take 20 mg by mouth 2 (two) times daily.  0   gabapentin (NEURONTIN) 300 MG capsule Take 300 mg by mouth 2 (two) times daily.      hydrALAZINE (APRESOLINE) 100 MG tablet Take 1 tablet (100 mg total) by mouth 3 (three) times daily. 270 tablet 1   indomethacin (INDOCIN) 50 MG capsule Take 50 mg by mouth 2 (two) times daily as needed for moderate pain (pain (gout)).  0   insulin  detemir (LEVEMIR) 100 UNIT/ML injection Inject 60 Units into the skin 2 (two) times daily.     LEVEMIR FLEXTOUCH 100 UNIT/ML FlexPen 60 Units 2 (two) times daily.     levocetirizine (XYZAL) 5 MG tablet Take 1 tablet by mouth daily.     losartan (COZAAR) 100 MG tablet Take 100 mg by mouth daily.     pentoxifylline (TRENTAL) 400 MG CR tablet Take 400 mg by mouth daily.     rosuvastatin (CRESTOR) 20 MG tablet Take 1 tablet (20 mg total) by mouth daily. 90 tablet 1   Semaglutide,0.25 or 0.'5MG'$ /DOS, (OZEMPIC, 0.25 OR 0.5 MG/DOSE,) 2 MG/1.5ML SOPN Inject 0.25 mg into the skin once a week.     Ascorbic Acid (SM CHEWABLE VITAMIN C) 500 MG CHEW Chew 500 mg by mouth daily. (Patient not taking: Reported on 09/25/2022)     Biotin w/ Vitamins C & E (HAIR/SKIN/NAILS PO) Take 2 tablets by mouth daily. Gummy (Patient not taking: Reported on 12/02/2021)     Black Elderberry (SAMBUCUS ELDERBERRY PO) Take 1,250 mg by mouth daily. (Patient not taking: Reported on 12/02/2021)     liraglutide (VICTOZA) 18 MG/3ML SOPN Inject 1.2 mg into the skin daily. (Patient not taking: Reported on 01/09/2023)     meloxicam (MOBIC) 15 MG tablet Take 15 mg by mouth daily. (Patient not taking: Reported on 12/02/2021)     SLOW FE 142 (45 Fe) MG TBCR Take 1 tablet by mouth daily. (Patient not taking: Reported on 12/02/2021)     No current facility-administered medications for this visit.    OBJECTIVE: Vitals:   01/09/23 1418  BP: (!) 161/66  Pulse: 76  Temp: (!) 97.3 F (36.3 C)      Body mass index is 59.99 kg/m.      General: Well-developed, well-nourished, no acute distress. Eyes: Pink conjunctiva, anicteric sclera. HEENT: Normocephalic, moist mucous membranes, clear oropharnyx. Lungs: Clear to auscultation bilaterally. Heart: Regular rate and rhythm. No rubs, murmurs, or gallops. Abdomen: Soft, nontender, nondistended. No organomegaly noted, normoactive bowel sounds. Musculoskeletal: No edema, cyanosis, or clubbing. Neuro:  Alert, answering all questions appropriately. Cranial nerves grossly intact. Skin: No rashes or petechiae noted. Psych: Normal affect. Lymphatics: No cervical, calvicular, axillary or inguinal LAD.   LAB RESULTS:  Lab Results  Component Value Date   NA 144 07/23/2020   K 3.7 07/23/2020   CL 104 07/23/2020   CO2 26 07/31/2017   GLUCOSE 151 (H) 07/23/2020   BUN 24 (H) 07/23/2020   CREATININE 0.80 07/23/2020   CALCIUM 9.1 07/31/2017   PROT 7.6 07/31/2017   ALBUMIN 3.7 07/31/2017   AST 20 07/31/2017   ALT 17 07/31/2017   ALKPHOS 62 07/31/2017   BILITOT  0.3 07/31/2017   GFRNONAA >60 07/31/2017   GFRAA >60 07/31/2017    Lab Results  Component Value Date   WBC 10.3 07/31/2017   HGB 10.9 (L) 07/23/2020   HCT 32.0 (L) 07/23/2020   MCV 69.5 (L) 07/31/2017   PLT 258 07/31/2017    Lab Results  Component Value Date   TIBC 263 09/25/2022   FERRITIN 434 (H) 09/25/2022   IRONPCTSAT 22 09/25/2022     STUDIES: No results found.  ASSESSMENT AND PLAN:   Julie Sawyer is a 67 y.o. female with pmh of hypertension, CAD, diabetes, anemia, OSA on CPAP was referred to hematology for work-up of anemia  #Chronic microcytic anemia - ?undiagnosed hemoglobinopathy  -On lab review, dating back to 2014 patient baseline hemoglobin has been between 9-10.  With MCV of 69-71.  WBC and platelets normal.  Has family history of thalassemia and son.  Hemoglobin electrophoresis was normal.  Patient would like to proceed with alpha thalassemia genotyping.  Iron panel was normal.  # Severe B12 deficiency -B12 from 09/26/2022 141. -s/p B12 injection x 4 doses.  Will give 1 injection today.  Advised patient to start maintenance oral B12 1000 mcg.  Recheck B12 level today.  RTC in 6 months for MD visit, labs.  Patient expressed understanding and was in agreement with this plan. She also understands that She can call clinic at any time with any questions, concerns, or complaints.   I spent a total  of 30 minutes reviewing chart data, face-to-face evaluation with the patient, counseling and coordination of care as detailed above.  Jane Canary, MD   01/09/2023 3:01 PM

## 2023-01-09 NOTE — Patient Instructions (Signed)
Take Vitamin B12 pill 1000 mcg once daily.

## 2023-01-16 ENCOUNTER — Other Ambulatory Visit: Payer: Self-pay | Admitting: Cardiovascular Disease

## 2023-01-16 DIAGNOSIS — I739 Peripheral vascular disease, unspecified: Secondary | ICD-10-CM

## 2023-01-19 ENCOUNTER — Other Ambulatory Visit: Payer: Self-pay | Admitting: Cardiovascular Disease

## 2023-01-19 DIAGNOSIS — I10 Essential (primary) hypertension: Secondary | ICD-10-CM

## 2023-01-24 LAB — ALPHA-THALASSEMIA GENOTYPR

## 2023-02-18 ENCOUNTER — Other Ambulatory Visit: Payer: Self-pay | Admitting: Cardiology

## 2023-02-23 ENCOUNTER — Other Ambulatory Visit: Payer: Self-pay

## 2023-02-23 MED ORDER — FUROSEMIDE 20 MG PO TABS: 20.0000 mg | ORAL_TABLET | Freq: Two times a day (BID) | ORAL | 0 refills | Status: DC

## 2023-03-05 ENCOUNTER — Ambulatory Visit (INDEPENDENT_AMBULATORY_CARE_PROVIDER_SITE_OTHER): Payer: Medicare Other | Admitting: Internal Medicine

## 2023-03-05 VITALS — BP 147/68 | HR 71 | Resp 18 | Ht 59.0 in | Wt 279.0 lb

## 2023-03-05 DIAGNOSIS — G4733 Obstructive sleep apnea (adult) (pediatric): Secondary | ICD-10-CM

## 2023-03-05 DIAGNOSIS — I10 Essential (primary) hypertension: Secondary | ICD-10-CM | POA: Diagnosis not present

## 2023-03-05 DIAGNOSIS — Z7189 Other specified counseling: Secondary | ICD-10-CM | POA: Diagnosis not present

## 2023-03-05 NOTE — Progress Notes (Signed)
[facility] [address] [place], [place] [address]  Pulmonary Sleep Medicine   Office Visit Note  Patient Name: Julie Sawyer DOB: Nov 28, 1955 MRN [id number]    Chief Complaint: Obstructive Sleep Apnea visit  Brief History:  Julie Sawyer is seen today for an annual follow up on CPAP at 12 cmh20.  The patient has a 9 year history of sleep apnea. Patient is using PAP nightly.  The patient feels rested after sleeping with PAP.  The patient reports benefiting from PAP use. Reported sleepiness is  improved and the Epworth Sleepiness Score is 2 out of 24. The patient does not take naps. The patient complains of the following: No complaints.  The compliance download shows 99% compliance with an average use time of 9:11 hours. The AHI is 0.7.  The patient does not complain of limb movements disrupting sleep.  ROS  General: (-) fever, (-) chills, (-) night sweat Nose and Sinuses: (-) nasal stuffiness or itchiness, (-) postnasal drip, (-) nosebleeds, (-) sinus trouble. Mouth and Throat: (-) sore throat, (-) hoarseness. Neck: (-) swollen glands, (-) enlarged thyroid, (-) neck pain. Respiratory: - cough, - shortness of breath, - wheezing. Neurologic: - numbness, - tingling. Psychiatric: - anxiety, - depression   Current Medication: Outpatient Encounter Medications as of 03/05/2023  Medication Sig Note   azelastine (ASTELIN) 0.1 % nasal spray Place into both nostrils.    cyclobenzaprine (FLEXERIL) 5 MG tablet Take 5 mg by mouth at bedtime as needed.    fluticasone (FLONASE) 50 MCG/ACT nasal spray Place 1 spray into both nostrils daily.    gabapentin (NEURONTIN) 300 MG capsule Take by mouth.    LANTUS SOLOSTAR 100 UNIT/ML Solostar Pen SMARTSIG:60 Unit(s) SUB-Q Twice Daily    OZEMPIC, 0.25 OR 0.5 MG/DOSE, 2 MG/3ML SOPN Inject into the skin.    Ascorbic Acid (SM CHEWABLE VITAMIN C) 500 MG CHEW Chew 500 mg by mouth daily. (Patient not taking: Reported on 09/25/2022)    aspirin EC 81 MG  tablet Take 81 mg by mouth daily. Swallow whole.    Biotin w/ Vitamins C & E (HAIR/SKIN/NAILS PO) Take 2 tablets by mouth daily. Gummy (Patient not taking: Reported on 12/02/2021)    Black Elderberry (SAMBUCUS ELDERBERRY PO) Take 1,250 mg by mouth daily. (Patient not taking: Reported on 12/02/2021)    carvedilol (COREG) 6.25 MG tablet Take 6.25 mg by mouth 2 (two) times daily.    cloNIDine (CATAPRES) 0.1 MG tablet TAKE 1 TABLET BY MOUTH TWICE DAILY    furosemide (LASIX) 20 MG tablet Take 1 tablet (20 mg total) by mouth 2 (two) times daily.    hydrALAZINE (APRESOLINE) 100 MG tablet Take 1 tablet (100 mg total) by mouth 3 (three) times daily.    indomethacin (INDOCIN) 50 MG capsule Take 50 mg by mouth 2 (two) times daily as needed for moderate pain (pain (gout)).    LEVEMIR FLEXTOUCH 100 UNIT/ML FlexPen 60 Units 2 (two) times daily.    levocetirizine (XYZAL) 5 MG tablet Take 1 tablet by mouth daily.    losartan (COZAAR) 100 MG tablet Take 100 mg by mouth daily.    meloxicam (MOBIC) 15 MG tablet Take 15 mg by mouth daily. (Patient not taking: Reported on 12/02/2021)    pentoxifylline (TRENTAL) 400 MG CR tablet TAKE 1 TABLET BY MOUTH EVERY DAY    rosuvastatin (CRESTOR) 20 MG tablet Take 1 tablet (20 mg total) by mouth daily.    SLOW FE 142 (45 Fe) MG TBCR Take 1 tablet  by mouth daily. (Patient not taking: Reported on 12/02/2021) 07/15/2020: New medication patient has not started   [DISCONTINUED] gabapentin (NEURONTIN) 300 MG capsule Take 300 mg by mouth 2 (two) times daily.     [DISCONTINUED] insulin detemir (LEVEMIR) 100 UNIT/ML injection Inject 60 Units into the skin 2 (two) times daily.    [DISCONTINUED] liraglutide (VICTOZA) 18 MG/3ML SOPN Inject 1.2 mg into the skin daily. (Patient not taking: Reported on 01/09/2023)    [DISCONTINUED] Semaglutide,0.25 or 0.5MG /DOS, (OZEMPIC, 0.25 OR 0.5 MG/DOSE,) 2 MG/1.5ML SOPN Inject 0.25 mg into the skin once a week.    No facility-administered encounter medications  on file as of 03/05/2023.    Surgical History: Past Surgical History:  Procedure Laterality Date   BREAST BIOPSY Left    neg   BREAST SURGERY Left    CATARACT EXTRACTION W/PHACO Right 06/03/2020   Procedure: CATARACT EXTRACTION PHACO AND INTRAOCULAR LENS PLACEMENT (IOC) RIGHT DIABETIC;  Surgeon: Elliot Cousin, MD;  Location: [facility] ORS;  Service: Ophthalmology;  Laterality: Right;  Lot # [id number] H US;00:20.0 CDE: 2.69   CATARACT EXTRACTION W/PHACO Left 07/23/2020   Procedure: CATARACT EXTRACTION PHACO AND INTRAOCULAR LENS PLACEMENT (IOC) LEFT VISION BLUE DIABETIC;  Surgeon: Elliot Cousin, MD;  Location: [facility] ORS;  Service: Ophthalmology;  Laterality: Left;  [place] 00:17.5 CDE 2.97 AP% 8.2 Fluid Pack Lot # [id number]   CESAREAN SECTION  1989   DILATATION & CURETTAGE/HYSTEROSCOPY WITH MYOSURE N/A 08/28/2017   Procedure: DILATATION & CURETTAGE/HYSTEROSCOPY WITH MYOSURE;  Surgeon: Conard Novak, MD;  Location: [facility] ORS;  Service: Gynecology;  Laterality: N/A;   DILATION AND CURETTAGE OF UTERUS     ENDOMETRIAL ABLATION N/A 08/28/2017   Procedure: ENDOMETRIAL POLYPECTOMY;  Surgeon: Conard Novak, MD;  Location: [facility] ORS;  Service: Gynecology;  Laterality: N/A;   TONSILLECTOMY     TUBAL LIGATION      Medical History: Past Medical History:  Diagnosis Date   Anemia    Arthritis    Bursitis    Coronary artery disease    Diabetes mellitus without complication    Elevated lipids    Gout    Hypertension    Neuropathy    Obesity    Shortness of breath dyspnea    Sleep apnea    use CPAP   Vaginal bleeding     Family History: Non contributory to the present illness  Social History: Social History   Socioeconomic History   Marital status: Married    Spouse name: Not on file   Number of children: Not on file   Years of education: Not on file   Highest education level: Not on file  Occupational History   Not on file  Tobacco Use   Smoking status: Former    Packs/day: 0.75     Years: 20.00    Additional pack years: 0.00    Total pack years: 15.00    Types: Cigarettes    Quit date: 11/13/1997    Years since quitting: 25.3   Smokeless tobacco: Never  Vaping Use   Vaping Use: Never used  Substance and Sexual Activity   Alcohol use: No   Drug use: No   Sexual activity: Not on file  Other Topics Concern   Not on file  Social History Narrative   Not on file   Social Determinants of Health   Financial Resource Strain: Not on file  Food Insecurity: Not on file  Transportation Needs: Not on file  Physical Activity: Not on file  Stress: Not  on file  Social Connections: Not on file  Intimate Partner Violence: Not on file    Vital Signs: Blood pressure (!) 147/68, pulse 71, resp. rate 18, height 4\' 11"  (1.499 m), weight 279 lb (126.6 kg), SpO2 99 %. Body mass index is 56.35 kg/m.    Examination: General Appearance: The patient is well-developed, well-nourished, and in no distress. Neck Circumference: 45 cm Skin: Gross inspection of skin unremarkable. Head: normocephalic, no gross deformities. Eyes: no gross deformities noted. ENT: ears appear grossly normal Neurologic: Alert and oriented. No involuntary movements.  STOP BANG RISK ASSESSMENT S (snore) Have you been told that you snore?     NO   T (tired) Are you often tired, fatigued, or sleepy during the day?   NO  O (obstruction) Do you stop breathing, choke, or gasp during sleep? NO   P (pressure) Do you have or are you being treated for high blood pressure? YES   B (BMI) Is your body index greater than 35 kg/m? YES   A (age) Are you 7 years old or older? YES   N (neck) Do you have a neck circumference greater than 16 inches?   YES   G (gender) Are you a female? NO   TOTAL STOP/BANG "YES" ANSWERS 4       A STOP-Bang score of 2 or less is considered low risk, and a score of 5 or more is high risk for having either moderate or severe OSA. For people who score 3 or 4, doctors may need to  perform further assessment to determine how likely they are to have OSA.         EPWORTH SLEEPINESS SCALE:  Scale:  (0)= no chance of dozing; (1)= slight chance of dozing; (2)= moderate chance of dozing; (3)= high chance of dozing  Chance  Situtation    Sitting and reading: 1    Watching TV: 1    Sitting Inactive in public: 0    As a passenger in car: 0      Lying down to rest: 0    Sitting and talking: 0    Sitting quielty after lunch: 0    In a car, stopped in traffic: 0   TOTAL SCORE:   2 out of 24    SLEEP STUDIES:  SPLIT (01/12/14)  AHI 24.4, right lateral AHI 45.8, min SPO2 84%, recommended CPAP at 12 cmh20   CPAP COMPLIANCE DATA:  Date Range: 03/01/22 - 02/28/23  Average Daily Use: 9:11 hours  Median Use: 8 hours 53 minutes  Compliance for > 4 Hours: 363 days  AHI: 0.7 respiratory events per hour  Days Used: 365/365  Mask Leak: 45.1  95th Percentile Pressure: 12 cmh20         LABS: Recent Results (from the past 2160 hour(s))  Alpha-Thalassemia GenotypR     Status: None   Collection Time: 01/09/23  2:52 PM  Result Value Ref Range   Alpha-Thalassemia Comment:     Comment: (NOTE) Test: Alpha-Thalassemia, DNA Analysis Result:     Alpha-thalassemia minor, alpha-/alpha- Mutation(s) identified:  alpha3.7 and alpha3.7 Interpretation: This result is most consistent with 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 The two remaining alpha-globin genes are arranged in trans, one copy located on each chromosome 16 homolog. Individuals with alpha-thalassemia minor can demonstrate microcytosis, hypocromia, and normal levels of HbA2 and HbF. However, they typically have no significant clinical findings. Additionally, this individual  is negative for the Hemoglobin Constant Spring mutation. The presence of other non-deletional mutations, like point  mutations, in the alpha-globin gene cannot be excluded as the assay does not detect these changes.  Co-inheritance of alpha-thalassemia and other hemoglobinopathies, such as beta-thalassemia and sickle cell dis ease is possible.  Genetic counseling and hemoglobinopathy testing are recommended for this individual's reproductive partner and family members. Alpha-thalassemia is an autosomal recessive condition that results from a deletion or dysfunction of alpha-globin chains. There are four genes that make the alpha-globin chain, which binds with the beta-globin chain to create the alpha alpha/beta beta hemoglobin tetramer. An imbalance of alpha and beta chains can lead to disease, ranging from mild anemia to fatal hydrops fetalis. Hemoglobin Constant Spring is a non-deletional alpha-thalassemia mutation that is more common in [place]. Hemoglobin Constant Spring is caused by a mutation in the stop codon of the alpha(2)-globin gene that results in poor alpha-globin production. The assay detects over 90% of alpha-globin deletions depending on an individual's ethnic background. The analysis does not detect mutations in the beta-globin gene, such as beta-thalassemia or sick le cell disease. Diagnosis of alpha-thalassemia should not rely on DNA testing alone, but should take into consideration clinical symptoms and other test results, such as hemoglobin fractionation testing. Genetic counselors are available for health care providers to discuss this result further at [phone]. Methodology: DNA analysis of the alpha-globin locus (HBA1/HBA2, OMIM 141800 / 141850, 16pter-16p13.3) is performed by multiplex ligation-dependent probe amplification (MLPA). This methodology detects copy number changes by targeting 24 different sequences in the alpha-globin region, and will, in principle, detect all genomic deletions and duplications involving this locus, as well as the Constant Spring  point mutation. DNA analysis of the alpha-globin gene deletions performed by multiplex polymerase chain reaction (PCR) followed by agarose gel electrophoresis may on occasion be used for confirmatory purpose. The mutations analyzed in this manner are alpha3.7,  alpha4.2, Southeast Asian type (--SEA), Filipino type (--FIL), [place] type(--THAI), Mediterranean (--MED) and alpha (20.5). The analysis does not detect other point mutations within this region, nor does it detect mutations of the beta-globin gene, that underlie beta-thalassemia or sickle cell disease. Results of this test are for investigational and research purposes only per the assay's manufacturer. The performance characteristics of this assay have not been established by the manufacturer. The result should not be used for treatment or for diagnostic purposes without confirmation of the diagnosis by another medically established diagnostic product or procedure. The performance characteristics were determined by [facility]. The presence of a rare mutation cannot be entirely ruled out. Molecular-based testing is highly accurate, but as in any laboratory test, rare diagnostic error may occur. References: 1. Chong SS et al. Janeann Forehand multiplex-PCR screen for   commo n deletional determinants of alpha-thalassemia.   2000.  Blood 95:360-362. 2. Laverta Baltimore et al. Simplified Multiplex-PCR diagnosis of   common Southeast Asian deletional determinants of alpha-   thalassemia. 2000. Clin Chem 46(10):1692-5. 3. Schrier SL et al. The unusal pathobiology of Hemoglobin   Constant Spring red blood cells.   1977. Blood 89:1762-1769. 4. Singer ST et al. Hemoglobin H-Constant Spring in [place]: an alpha thalassemia with frequent complications   . 2009. Am J Hematology [phone]. 5. Uaprasert N et al. Hematological characteristics and   effective screening for compound heterozygosity for Hb   Constant Spring and deletional  alpha-+-thalassemia.   2011. Am J Hematology [phone]. 6. Galanello R and Cao A. Alpha-Thalassemia. 2011.   GeneReviews. www.genetests.org 7.  [place]  [facility]  [facility]   [email or web address] as cited on   12/10/2003. 8. Christell Constant, Han H, et al. Detection of alpha-th alassemia in   [place] by using multiplex ligation-dependent probe   amplification. 2008. Hemoglobin 32:561-571. 9. [facility] SALSA MLPA P140-B2 HBA kit RUO package insert   version 16. Results Released By: Elwyn Lade, PhD, [facility] [address] [place], [place] [address] Report Released By: Timoteo Expose, MS, CGC, Genetic Counselor Results for this test are for research purposes only by the assay's manufacturer.  The performance characteristics of this product have not been established.  Results should not be used as a diagnostic procedure without confirmation of the diagnosis by another medically established diagnostic product or procedure. Performed At: [facility] [address] [place], [place] [id number] Maurine Simmering MDPhD [id number]   Vitamin B12     Status: Abnormal   Collection Time: 01/09/23  2:52 PM  Result Value Ref Range   Vitamin B-12 >7,500 (H) 180 - 914 pg/mL    Comment: HEMOLYSIS AT THIS LEVEL MAY AFFECT RESULT RESULT CONFIRMED BY AUTOMATED DILUTION (NOTE) This assay is not validated for testing neonatal or myeloproliferative syndrome specimens for Vitamin B12 levels. Performed at [facility], [address], [place], [place] [address]   CBC with Differential     Status: Abnormal   Collection Time: 01/09/23  2:52 PM  Result Value Ref Range   WBC 11.0 (H) 4.0 - 10.5 K/uL   RBC 4.45 3.87 - 5.11 MIL/uL   Hemoglobin 9.6 (L) 12.0 - 15.0 g/dL    Comment: Reticulocyte Hemoglobin testing may be clinically indicated, consider ordering this additional test [id number]    HCT 32.3 (L) 36.0 - 46.0 %   MCV 72.6 (L) 80.0 - 100.0 fL   MCH 21.6 (L)  26.0 - 34.0 pg   MCHC 29.7 (L) 30.0 - 36.0 g/dL   RDW 52.8 (H) 41.3 - 24.4 %   Platelets 302 150 - 400 K/uL   nRBC 0.0 0.0 - 0.2 %   Neutrophils Relative % 58 %   Neutro Abs 6.4 1.7 - 7.7 K/uL   Lymphocytes Relative 29 %   Lymphs Abs 3.2 0.7 - 4.0 K/uL   Monocytes Relative 7 %   Monocytes Absolute 0.8 0.1 - 1.0 K/uL   Eosinophils Relative 4 %   Eosinophils Absolute 0.4 0.0 - 0.5 K/uL   Basophils Relative 1 %   Basophils Absolute 0.1 0.0 - 0.1 K/uL   Immature Granulocytes 1 %   Abs Immature Granulocytes 0.07 0.00 - 0.07 K/uL    Comment: Performed at [facility], [address] [place], [place] [address]    Radiology: MM DIAG BREAST TOMO UNI LEFT  Result Date: 07/31/2022 CLINICAL DATA:  BI-RADS 3 follow-up of LEFT axillary lymph nodes. There are mildly prominent on prior ultrasound. First-time mammographic visualization this particular lymph node the lower outer LEFT axilla EXAM: DIGITAL DIAGNOSTIC UNILATERAL LEFT MAMMOGRAM WITH TOMOSYNTHESIS; [place] AXILLARY LEFT TECHNIQUE: Left digital diagnostic mammography and breast tomosynthesis was performed.; Targeted ultrasound examination of the left axilla was performed. Best images possible per technologist communication. COMPARISON:  Previous exam(s). ACR Breast Density Category b: There are scattered areas of fibroglandular density. FINDINGS: Diagnostic images of the LEFT breast demonstrate mammographic stability of multiple LEFT axillary lymph nodes dating back to at least 2015. There is a persistent prominent LEFT low axillary lymph node, favored to be outside of the field of view on  multiple additional prior mammograms. On physical exam, no suspicious mass is appreciated. Patient has a history of elevated blood pressure, elevated cholesterol and gout. Targeted ultrasound was performed of the LEFT axilla. Low LEFT axillary lymph node demonstrates a cortical thickness of approximately 3 mm. Lymph nodes demonstrate preserved echogenic hila.  Targeted ultrasound was performed of the contralateral axilla for comparison purposes. RIGHT axillary lymph nodes are symmetric in appearance and demonstrate a cortical thickness of approximately 3 mm. IMPRESSION: There is symmetric bilateral axillary lymph node prominence with borderline cortical thickening. No sonographic evidence of malignancy. This is favored to reflect patient's baseline configuration of lymph nodes. Recommend clinical evaluation to evaluate for potential underlying causes of symmetric adenopathy (autoimmune disorders, HIV, sarcoidosis or lymphoproliferative disorders). If tissue sampling is warranted after clinical evaluation, LEFT axillary lymph nodes are amenable for ultrasound-guided biopsy. RECOMMENDATION: Bilateral annual screening mammography, due June 2024. Recommend clinical evaluation to evaluate for potential underlying causes of symmetric adenopathy (autoimmune disorders, HIV, sarcoidosis or lymphoproliferative disorders). If tissue sampling is warranted after clinical evaluation, LEFT axillary lymph nodes are amenable for ultrasound-guided biopsy. I have discussed the findings and recommendations with the patient. If applicable, a reminder letter will be sent to the patient regarding the next appointment. BI-RADS CATEGORY  2: Benign. Electronically Signed   By: Meda Klinefelter M.D.   On: 07/31/2022 12:46  [place] AXILLA LEFT  Result Date: 07/31/2022 CLINICAL DATA:  BI-RADS 3 follow-up of LEFT axillary lymph nodes. There are mildly prominent on prior ultrasound. First-time mammographic visualization this particular lymph node the lower outer LEFT axilla EXAM: DIGITAL DIAGNOSTIC UNILATERAL LEFT MAMMOGRAM WITH TOMOSYNTHESIS; [place] AXILLARY LEFT TECHNIQUE: Left digital diagnostic mammography and breast tomosynthesis was performed.; Targeted ultrasound examination of the left axilla was performed. Best images possible per technologist communication. COMPARISON:  Previous exam(s). ACR  Breast Density Category b: There are scattered areas of fibroglandular density. FINDINGS: Diagnostic images of the LEFT breast demonstrate mammographic stability of multiple LEFT axillary lymph nodes dating back to at least 2015. There is a persistent prominent LEFT low axillary lymph node, favored to be outside of the field of view on multiple additional prior mammograms. On physical exam, no suspicious mass is appreciated. Patient has a history of elevated blood pressure, elevated cholesterol and gout. Targeted ultrasound was performed of the LEFT axilla. Low LEFT axillary lymph node demonstrates a cortical thickness of approximately 3 mm. Lymph nodes demonstrate preserved echogenic hila. Targeted ultrasound was performed of the contralateral axilla for comparison purposes. RIGHT axillary lymph nodes are symmetric in appearance and demonstrate a cortical thickness of approximately 3 mm. IMPRESSION: There is symmetric bilateral axillary lymph node prominence with borderline cortical thickening. No sonographic evidence of malignancy. This is favored to reflect patient's baseline configuration of lymph nodes. Recommend clinical evaluation to evaluate for potential underlying causes of symmetric adenopathy (autoimmune disorders, HIV, sarcoidosis or lymphoproliferative disorders). If tissue sampling is warranted after clinical evaluation, LEFT axillary lymph nodes are amenable for ultrasound-guided biopsy. RECOMMENDATION: Bilateral annual screening mammography, due June 2024. Recommend clinical evaluation to evaluate for potential underlying causes of symmetric adenopathy (autoimmune disorders, HIV, sarcoidosis or lymphoproliferative disorders). If tissue sampling is warranted after clinical evaluation, LEFT axillary lymph nodes are amenable for ultrasound-guided biopsy. I have discussed the findings and recommendations with the patient. If applicable, a reminder letter will be sent to the patient regarding the next  appointment. BI-RADS CATEGORY  2: Benign. Electronically Signed   By: Meda Klinefelter M.D.   On: 07/31/2022 12:46  No results found.  No results found.    Assessment and Plan: Patient Active Problem List   Diagnosis Date Noted   B12 deficiency 10/09/2022   Anemia 09/25/2022   Microcytosis 09/25/2022   CPAP use counseling 01/03/2021   Morbid obesity 01/03/2021   Hypertension 01/03/2021   OSA on CPAP 01/03/2021   Postmenopausal bleeding 04/17/2017   Simple endometrial hyperplasia without atypia 04/17/2017   Endometrial polyp 04/17/2017  1. OSA on CPAP The patient does tolerate PAP and reports  benefit from PAP use. The patient was reminded how to clean equipment and advised to replace supplies routinely. The patient was also counselled on weight loss. The compliance is excellent. The AHI is 0.7.   OSA on cpap- controlled. Continue with excellent compliance with pap. CPAP continues to be medically necessary to treat this patient's OSA. F/u one year.    2. CPAP use counseling CPAP Counseling: had a lengthy discussion with the patient regarding the importance of PAP therapy in management of the sleep apnea. Patient appears to understand the risk factor reduction and also understands the risks associated with untreated sleep apnea. Patient will try to make a good faith effort to remain compliant with therapy. Also instructed the patient on proper cleaning of the device including the water must be changed daily if possible and use of distilled water is preferred. Patient understands that the machine should be regularly cleaned with appropriate recommended cleaning solutions that do not damage the PAP machine for example given white vinegar and water rinses. Other methods such as ozone treatment may not be as good as these simple methods to achieve cleaning.   3. Morbid obesity Obesity Counseling: Had a lengthy discussion regarding patients BMI and weight issues. Patient was instructed on  portion control as well as increased activity. Also discussed caloric restrictions with trying to maintain intake less than 2000 Kcal. Discussions were made in accordance with the 5As of weight management. Simple actions such as not eating late and if able to, taking a walk is suggested.   4. Hypertension, unspecified type Hypertension Counseling:   The following hypertensive lifestyle modification were recommended and discussed:  1. Limiting alcohol intake to less than 1 oz/day of ethanol:(24 oz of beer or 8 oz of wine or 2 oz of 100-proof whiskey). 2. Take baby ASA 81 mg daily. 3. Importance of regular aerobic exercise and losing weight. 4. Reduce dietary saturated fat and cholesterol intake for overall cardiovascular health. 5. Maintaining adequate dietary potassium, calcium, and magnesium intake. 6. Regular monitoring of the blood pressure. 7. Reduce sodium intake to less than 100 mmol/day (less than 2.3 gm of sodium or less than 6 gm of sodium choride)       General Counseling: I have discussed the findings of the evaluation and examination with Burna Mortimer.  I have also discussed any further diagnostic evaluation thatmay be needed or ordered today. Leia verbalizes understanding of the findings of todays visit. We also reviewed her medications today and discussed drug interactions and side effects including but not limited excessive drowsiness and altered mental states. We also discussed that there is always a risk not just to her but also people around her. she has been encouraged to call the office with any questions or concerns that should arise related to todays visit.  No orders of the defined types were placed in this encounter.       I have personally obtained a history, examined the patient, evaluated laboratory and imaging results, formulated the  assessment and plan and placed orders. This patient was seen today by Emmaline Kluver, PA-C in collaboration with Dr. Freda Munro.   Yevonne Pax, MD [facility] Diplomate ABMS Pulmonary Critical Care Medicine and Sleep Medicine

## 2023-03-05 NOTE — Patient Instructions (Signed)

## 2023-03-07 ENCOUNTER — Other Ambulatory Visit: Payer: Self-pay | Admitting: Cardiology

## 2023-03-12 ENCOUNTER — Other Ambulatory Visit: Payer: Self-pay

## 2023-03-13 ENCOUNTER — Other Ambulatory Visit: Payer: Self-pay

## 2023-03-13 MED ORDER — CARVEDILOL 6.25 MG PO TABS: 6.2500 mg | ORAL_TABLET | Freq: Two times a day (BID) | ORAL | 3 refills | Status: DC

## 2023-04-03 ENCOUNTER — Encounter: Payer: Self-pay | Admitting: Internal Medicine

## 2023-04-05 ENCOUNTER — Encounter: Payer: Self-pay | Admitting: Internal Medicine

## 2023-04-10 ENCOUNTER — Other Ambulatory Visit: Payer: Self-pay | Admitting: Cardiovascular Disease

## 2023-04-12 ENCOUNTER — Encounter: Payer: Self-pay | Admitting: Cardiovascular Disease

## 2023-04-12 ENCOUNTER — Ambulatory Visit: Payer: Medicare Other | Admitting: Cardiovascular Disease

## 2023-04-12 ENCOUNTER — Encounter: Payer: Self-pay | Admitting: Internal Medicine

## 2023-04-12 VITALS — BP 132/68 | HR 70 | Ht 59.0 in | Wt 292.0 lb

## 2023-04-12 DIAGNOSIS — I10 Essential (primary) hypertension: Secondary | ICD-10-CM | POA: Diagnosis not present

## 2023-04-12 DIAGNOSIS — E782 Mixed hyperlipidemia: Secondary | ICD-10-CM

## 2023-04-12 DIAGNOSIS — I739 Peripheral vascular disease, unspecified: Secondary | ICD-10-CM

## 2023-04-12 DIAGNOSIS — G4733 Obstructive sleep apnea (adult) (pediatric): Secondary | ICD-10-CM | POA: Diagnosis not present

## 2023-04-12 NOTE — Progress Notes (Signed)
Cardiology Office Note   Date:  04/12/2023   ID:  Keshawn, Kellas Sep 12, 1956, MRN [id number]  PCP:  Emogene Morgan, MD  Cardiologist:  Adrian Blackwater, MD      History of Present Illness: Julie Sawyer is a 67 y.o. female who presents for  Chief Complaint  Patient presents with   Follow-up    4 month follow up    Has left carotid bruit noticed home health visit from [facility].      Past Medical History:  Diagnosis Date   Anemia    Arthritis    Bursitis    Coronary artery disease    Diabetes mellitus without complication (HCC)    Elevated lipids    Gout    Hypertension    Neuropathy    Obesity    Shortness of breath dyspnea    Sleep apnea    use CPAP   Vaginal bleeding      Past Surgical History:  Procedure Laterality Date   BREAST BIOPSY Left    neg   BREAST SURGERY Left    CATARACT EXTRACTION W/PHACO Right 06/03/2020   Procedure: CATARACT EXTRACTION PHACO AND INTRAOCULAR LENS PLACEMENT (IOC) RIGHT DIABETIC;  Surgeon: Elliot Cousin, MD;  Location: [facility];  Service: Ophthalmology;  Laterality: Right;  Lot # [id number] H US;00:20.0 CDE: 2.69   CATARACT EXTRACTION W/PHACO Left 07/23/2020   Procedure: CATARACT EXTRACTION PHACO AND INTRAOCULAR LENS PLACEMENT (IOC) LEFT VISION BLUE DIABETIC;  Surgeon: Elliot Cousin, MD;  Location: [facility];  Service: Ophthalmology;  Laterality: Left;  [place] 00:17.5 CDE 2.97 AP% 8.2 Fluid Pack Lot # [id number]   CESAREAN SECTION  1989   DILATATION & CURETTAGE/HYSTEROSCOPY WITH MYOSURE N/A 08/28/2017   Procedure: DILATATION & CURETTAGE/HYSTEROSCOPY WITH MYOSURE;  Surgeon: Conard Novak, MD;  Location: [facility];  Service: Gynecology;  Laterality: N/A;   DILATION AND CURETTAGE OF UTERUS     ENDOMETRIAL ABLATION N/A 08/28/2017   Procedure: ENDOMETRIAL POLYPECTOMY;  Surgeon: Conard Novak, MD;  Location: [facility];  Service: Gynecology;  Laterality: N/A;   TONSILLECTOMY     TUBAL LIGATION       Current Outpatient  Medications  Medication Sig Dispense Refill   Ascorbic Acid (SM CHEWABLE VITAMIN C) 500 MG CHEW Chew 500 mg by mouth daily.     aspirin EC 81 MG tablet Take 81 mg by mouth daily. Swallow whole.     azelastine (ASTELIN) 0.1 % nasal spray Place into both nostrils.     Biotin w/ Vitamins C & E (HAIR/SKIN/NAILS PO) Take 2 tablets by mouth daily. Gummy     Black Elderberry (SAMBUCUS ELDERBERRY PO) Take 1,250 mg by mouth daily.     carvedilol (COREG) 6.25 MG tablet Take 1 tablet (6.25 mg total) by mouth 2 (two) times daily. 180 tablet 3   cloNIDine (CATAPRES) 0.1 MG tablet TAKE 1 TABLET BY MOUTH TWICE DAILY 180 tablet 0   cyclobenzaprine (FLEXERIL) 5 MG tablet Take 5 mg by mouth at bedtime as needed.     fluticasone (FLONASE) 50 MCG/ACT nasal spray Place 1 spray into both nostrils daily.     furosemide (LASIX) 20 MG tablet Take 1 tablet (20 mg total) by mouth 2 (two) times daily. 30 tablet 0   gabapentin (NEURONTIN) 300 MG capsule Take by mouth.     hydrALAZINE (APRESOLINE) 100 MG tablet Take 1 tablet (100 mg total) by mouth 3 (three) times daily. 270 tablet 1   indomethacin (INDOCIN) 50 MG  capsule Take 50 mg by mouth 2 (two) times daily as needed for moderate pain (pain (gout)).  0   LANTUS SOLOSTAR 100 UNIT/ML Solostar Pen SMARTSIG:60 Unit(s) SUB-Q Twice Daily     LEVEMIR FLEXTOUCH 100 UNIT/ML FlexPen 60 Units 2 (two) times daily.     levocetirizine (XYZAL) 5 MG tablet Take 1 tablet by mouth daily.     losartan (COZAAR) 100 MG tablet Take 100 mg by mouth daily.     meloxicam (MOBIC) 15 MG tablet Take 15 mg by mouth daily.     OZEMPIC, 0.25 OR 0.5 MG/DOSE, 2 MG/3ML SOPN Inject into the skin.     pentoxifylline (TRENTAL) 400 MG CR tablet TAKE 1 TABLET BY MOUTH EVERY DAY 30 tablet 3   rosuvastatin (CRESTOR) 20 MG tablet Take 1 tablet (20 mg total) by mouth daily. 90 tablet 1   SLOW FE 142 (45 Fe) MG TBCR Take 1 tablet by mouth daily.     No current facility-administered medications for this visit.     Allergies:   Penicillins    Social History:   reports that she quit smoking about 25 years ago. Her smoking use included cigarettes. She has a 15.00 pack-year smoking history. She has never used smokeless tobacco. She reports that she does not drink alcohol and does not use drugs.   Family History:  family history is not on file. She was adopted.    ROS:     Review of Systems  Constitutional: Negative.   HENT: Negative.    Eyes: Negative.   Respiratory: Negative.    Gastrointestinal: Negative.   Genitourinary: Negative.   Musculoskeletal: Negative.   Skin: Negative.   Neurological: Negative.   Endo/Heme/Allergies: Negative.   Psychiatric/Behavioral: Negative.    All other systems reviewed and are negative.     All other systems are reviewed and negative.    PHYSICAL EXAM: VS:  BP 132/68   Pulse 70   Ht 4\' 11"  (1.499 m)   Wt 292 lb (132.5 kg)   SpO2 97%   BMI 58.98 kg/m  , BMI Body mass index is 58.98 kg/m. Last weight:  Wt Readings from Last 3 Encounters:  04/12/23 292 lb (132.5 kg)  03/05/23 279 lb (126.6 kg)  01/09/23 297 lb (134.7 kg)     Physical Exam Constitutional:      Appearance: Normal appearance.  Cardiovascular:     Rate and Rhythm: Normal rate and regular rhythm.     Heart sounds: Normal heart sounds.  Pulmonary:     Effort: Pulmonary effort is normal.     Breath sounds: Normal breath sounds.  Musculoskeletal:     Right lower leg: No edema.     Left lower leg: No edema.  Neurological:     Mental Status: She is alert.       EKG:   Recent Labs: 01/09/2023: Hemoglobin 9.6; Platelets 302    Lipid Panel No results found for: "CHOL", "TRIG", "HDL", "CHOLHDL", "VLDL", "LDLCALC", "LDLDIRECT"    Other studies Reviewed: Additional studies/ records that were reviewed today include:  Review of the above records demonstrates:   REASON FOR VISIT  Visit for: Echocardiogram/I34.0  Sex:   Female  wt= 291   lbs.  BP=134/64  Height=60     inches.        TESTS  Imaging: Echocardiogram:  An echocardiogram in (2-d) mode was performed and in Doppler mode with color flow velocity mapping was performed. The aortic valve cusps are abnormal 1.0  cm, flow velocity 1.71  m/s, and systolic calculated mean flow gradient 8  mmHg. Mitral valve diastolic peak flow velocity E .893  m/s and E/A ratio 0.7. Aortic root diameter 3.7  cm. The LVOT internal diameter 3.3  cm and flow velocity was abnormal 1.10  m/s. LV systolic dimension 3.3  cm, diastolic 5.26  cm, posterior wall thickness 1.26   cm, fractional shortening 37.3 %, and EF 66.8 %. IVS thickness 1.46  cm. LA dimension 5.7 cm. Mitral Valve has Mild Regurgitation. Tricuspid Valve has Mild Regurgitation.     ASSESSMENT  Technically difficult study due to body habitus.  Normal chamber sizes.  Mild left ventricular hypertrophy with GRADE 3 ( restrictive physiology) diastolic dysfunction.  Normal right ventricular systolic function.  Normal right ventricular diastolic function.  Normal left ventricular wall motion.  Normal right ventricular wall motion.  Mild tricuspid regurgitation.  Moderate pulmonary hypertension.  Mild mitral regurgitation.  No pericardial effusion.  Mildly dilated Left atrium  Moderate LVH.     THERAPY   Referring physician: Laurier Nancy  Sonographer: Adrian Blackwater.      Adrian Blackwater MD  Electronically signed by: Adrian Blackwater     Date: 12/04/2022 14:20 REASON FOR VISIT  Referred by Dr. Welton Flakes.        TESTS  Imaging: Computed Tomographic Angiography:  Cardiac multidetector CT was performed paying particular attention to the coronary arteries for the diagnosis of: Abnormal results of cardiovascular function study. Diagnostic Drugs:  Administered iohexol (Omnipaque) through an antecubital vein and images from the examination were analyzed for the presence and extent of coronary artery disease, using 3D image processing software. 100  mL of non-ionic contrast (Omnipaque) was used.        ASSESSMENT   The proximal circumflex artery was abnormal:1  The distal circumflex artery was abnormal:1  The first obtuse marginal branch artery (OM-1) was abnormal:1  The proximal right coronary artery (RCA) was abnormal:1  The mid right coronary artery (RCA) was abnormal:1  The distal right coronary artery (RCA) was abnormal:1  The posterior descending coronary arteries were abnormal:1     TEST CONCLUSIONS  Quality of study: Suboptimal/Poor  1-Calcium score: 618  2-Right dominant system  3-LCX and RCA have minor irregularities. Unable to visualize LAD. Consider cardiac Cath if having chest pain.      Adrian Blackwater MD  Electronically signed by: Adrian Blackwater     Date: 05/28/2019 14:26     No data to display            ASSESSMENT AND PLAN:    ICD-10-CM   1. Hypertension, unspecified type  I10 US Carotid Bilateral    2. OSA on CPAP  G47.33 US Carotid Bilateral    3. Morbid obesity (HCC)  E66.01 US Carotid Bilateral    4. Mixed hyperlipidemia  E78.2 US Carotid Bilateral    5. Obstructive sleep apnea syndrome  G47.33 US Carotid Bilateral    6. PVD (peripheral vascular disease) (HCC)  I73.9 US Carotid Bilateral   hads left caroted bruit and with hh/o LE disease, advise caroted dopplers       Problem List Items Addressed This Visit       Cardiovascular and Mediastinum   Hypertension - Primary   Relevant Orders   US Carotid Bilateral     Respiratory   OSA on CPAP   Relevant Orders   US Carotid Bilateral     Other   Morbid obesity (HCC)   Relevant Orders  US Carotid Bilateral   Other Visit Diagnoses     Mixed hyperlipidemia       Relevant Orders   US Carotid Bilateral   Obstructive sleep apnea syndrome       Relevant Orders   US Carotid Bilateral   PVD (peripheral vascular disease) (HCC)       hads left caroted bruit and with hh/o LE disease, advise caroted dopplers   Relevant Orders   US  Carotid Bilateral          Disposition:   Return in about 4 weeks (around 05/10/2023) for after caroted u/s.    Total time spent: 30 minutes  Signed,  Adrian Blackwater, MD  04/12/2023 10:10 AM    [facility]

## 2023-04-13 ENCOUNTER — Encounter: Payer: Self-pay | Admitting: Internal Medicine

## 2023-04-13 MED ORDER — FUROSEMIDE 20 MG PO TABS: 20.0000 mg | ORAL_TABLET | Freq: Two times a day (BID) | ORAL | 0 refills | Status: DC

## 2023-04-13 NOTE — Addendum Note (Signed)
Addended byKatherine Mantle on: 04/13/2023 11:32 AM   Modules accepted: Orders

## 2023-04-18 ENCOUNTER — Ambulatory Visit (INDEPENDENT_AMBULATORY_CARE_PROVIDER_SITE_OTHER): Payer: Medicare Other

## 2023-04-18 DIAGNOSIS — E782 Mixed hyperlipidemia: Secondary | ICD-10-CM

## 2023-04-18 DIAGNOSIS — R55 Syncope and collapse: Secondary | ICD-10-CM

## 2023-04-18 DIAGNOSIS — I10 Essential (primary) hypertension: Secondary | ICD-10-CM

## 2023-04-18 DIAGNOSIS — G4733 Obstructive sleep apnea (adult) (pediatric): Secondary | ICD-10-CM

## 2023-04-18 DIAGNOSIS — I739 Peripheral vascular disease, unspecified: Secondary | ICD-10-CM

## 2023-04-20 ENCOUNTER — Other Ambulatory Visit: Payer: Self-pay | Admitting: Cardiovascular Disease

## 2023-04-20 DIAGNOSIS — I10 Essential (primary) hypertension: Secondary | ICD-10-CM

## 2023-05-08 ENCOUNTER — Other Ambulatory Visit: Payer: Self-pay | Admitting: Cardiovascular Disease

## 2023-05-11 ENCOUNTER — Encounter: Payer: Self-pay | Admitting: Cardiovascular Disease

## 2023-05-11 ENCOUNTER — Encounter: Payer: Self-pay | Admitting: Internal Medicine

## 2023-05-11 ENCOUNTER — Ambulatory Visit (INDEPENDENT_AMBULATORY_CARE_PROVIDER_SITE_OTHER): Payer: Medicare Other | Admitting: Cardiovascular Disease

## 2023-05-11 VITALS — BP 118/62 | HR 80 | Ht 59.0 in | Wt 298.0 lb

## 2023-05-11 DIAGNOSIS — G4733 Obstructive sleep apnea (adult) (pediatric): Secondary | ICD-10-CM | POA: Diagnosis not present

## 2023-05-11 DIAGNOSIS — I6523 Occlusion and stenosis of bilateral carotid arteries: Secondary | ICD-10-CM

## 2023-05-11 DIAGNOSIS — I10 Essential (primary) hypertension: Secondary | ICD-10-CM

## 2023-05-11 DIAGNOSIS — E782 Mixed hyperlipidemia: Secondary | ICD-10-CM | POA: Diagnosis not present

## 2023-05-11 MED ORDER — ROSUVASTATIN CALCIUM 40 MG PO TABS
40.0000 mg | ORAL_TABLET | Freq: Every day | ORAL | 2 refills | Status: DC
Start: 2023-05-11 — End: 2023-11-08

## 2023-05-11 NOTE — Progress Notes (Signed)
Cardiology Office Note   Date:  05/11/2023   ID:  Julie Sawyer, DOB 10/14/1956, MRN [id number]  PCP:  Emogene Morgan, MD  Cardiologist:  Adrian Blackwater, MD      History of Present Illness: Julie Sawyer is a 67 y.o. female who presents for  Chief Complaint  Patient presents with   Follow-up    1 Month Follow Up    Doing well      Past Medical History:  Diagnosis Date   Anemia    Arthritis    Bursitis    Coronary artery disease    Diabetes mellitus without complication (HCC)    Elevated lipids    Gout    Hypertension    Neuropathy    Obesity    Shortness of breath dyspnea    Sleep apnea    use CPAP   Vaginal bleeding      Past Surgical History:  Procedure Laterality Date   BREAST BIOPSY Left    neg   BREAST SURGERY Left    CATARACT EXTRACTION W/PHACO Right 06/03/2020   Procedure: CATARACT EXTRACTION PHACO AND INTRAOCULAR LENS PLACEMENT (IOC) RIGHT DIABETIC;  Surgeon: Elliot Cousin, MD;  Location: [facility];  Service: Ophthalmology;  Laterality: Right;  Lot # [id number] H US;00:20.0 CDE: 2.69   CATARACT EXTRACTION W/PHACO Left 07/23/2020   Procedure: CATARACT EXTRACTION PHACO AND INTRAOCULAR LENS PLACEMENT (IOC) LEFT VISION BLUE DIABETIC;  Surgeon: Elliot Cousin, MD;  Location: [facility];  Service: Ophthalmology;  Laterality: Left;  [place] 00:17.5 CDE 2.97 AP% 8.2 Fluid Pack Lot # [id number] H   CESAREAN SECTION  1989   DILATATION & CURETTAGE/HYSTEROSCOPY WITH MYOSURE N/A 08/28/2017   Procedure: DILATATION & CURETTAGE/HYSTEROSCOPY WITH MYOSURE;  Surgeon: Conard Novak, MD;  Location: [facility];  Service: Gynecology;  Laterality: N/A;   DILATION AND CURETTAGE OF UTERUS     ENDOMETRIAL ABLATION N/A 08/28/2017   Procedure: ENDOMETRIAL POLYPECTOMY;  Surgeon: Conard Novak, MD;  Location: [facility];  Service: Gynecology;  Laterality: N/A;   TONSILLECTOMY     TUBAL LIGATION       Current Outpatient Medications  Medication Sig Dispense Refill   rosuvastatin  (CRESTOR) 40 MG tablet Take 1 tablet (40 mg total) by mouth daily. 30 tablet 2   Ascorbic Acid (SM CHEWABLE VITAMIN C) 500 MG CHEW Chew 500 mg by mouth daily.     aspirin EC 81 MG tablet Take 81 mg by mouth daily. Swallow whole.     azelastine (ASTELIN) 0.1 % nasal spray Place into both nostrils.     Biotin w/ Vitamins C & E (HAIR/SKIN/NAILS PO) Take 2 tablets by mouth daily. Gummy     Black Elderberry (SAMBUCUS ELDERBERRY PO) Take 1,250 mg by mouth daily.     carvedilol (COREG) 6.25 MG tablet Take 1 tablet (6.25 mg total) by mouth 2 (two) times daily. 180 tablet 3   cloNIDine (CATAPRES) 0.1 MG tablet TAKE 1 TABLET BY MOUTH TWICE DAILY 180 tablet 0   cyclobenzaprine (FLEXERIL) 5 MG tablet Take 5 mg by mouth at bedtime as needed.     fluticasone (FLONASE) 50 MCG/ACT nasal spray Place 1 spray into both nostrils daily.     furosemide (LASIX) 20 MG tablet TAKE 1 TABLET(20 MG) BY MOUTH TWICE DAILY 30 tablet 0   furosemide (LASIX) 20 MG tablet TAKE 1 TABLET(20 MG) BY MOUTH TWICE DAILY 60 tablet 0   gabapentin (NEURONTIN) 300 MG capsule Take by mouth.     hydrALAZINE (APRESOLINE)  100 MG tablet Take 1 tablet (100 mg total) by mouth 3 (three) times daily. 270 tablet 1   indomethacin (INDOCIN) 50 MG capsule Take 50 mg by mouth 2 (two) times daily as needed for moderate pain (pain (gout)).  0   LANTUS SOLOSTAR 100 UNIT/ML Solostar Pen SMARTSIG:60 Unit(s) SUB-Q Twice Daily     LEVEMIR FLEXTOUCH 100 UNIT/ML FlexPen 60 Units 2 (two) times daily.     levocetirizine (XYZAL) 5 MG tablet Take 1 tablet by mouth daily.     losartan (COZAAR) 100 MG tablet Take 100 mg by mouth daily.     meloxicam (MOBIC) 15 MG tablet Take 15 mg by mouth daily.     OZEMPIC, 0.25 OR 0.5 MG/DOSE, 2 MG/3ML SOPN Inject into the skin.     pentoxifylline (TRENTAL) 400 MG CR tablet TAKE 1 TABLET BY MOUTH EVERY DAY 30 tablet 3   rosuvastatin (CRESTOR) 20 MG tablet Take 1 tablet (20 mg total) by mouth daily. 90 tablet 1   SLOW FE 142 (45  Fe) MG TBCR Take 1 tablet by mouth daily.     No current facility-administered medications for this visit.    Allergies:   Penicillins    Social History:   reports that she quit smoking about 25 years ago. Her smoking use included cigarettes. She has a 15.00 pack-year smoking history. She has never used smokeless tobacco. She reports that she does not drink alcohol and does not use drugs.   Family History:  family history is not on file. She was adopted.    ROS:     Review of Systems  Constitutional: Negative.   HENT: Negative.    Eyes: Negative.   Respiratory: Negative.    Gastrointestinal: Negative.   Genitourinary: Negative.   Musculoskeletal: Negative.   Skin: Negative.   Neurological: Negative.   Endo/Heme/Allergies: Negative.   Psychiatric/Behavioral: Negative.    All other systems reviewed and are negative.     All other systems are reviewed and negative.    PHYSICAL EXAM: VS:  BP 118/62   Pulse 80   Ht 4\' 11"  (1.499 m)   Wt 298 lb (135.2 kg)   SpO2 94%   BMI 60.19 kg/m  , BMI Body mass index is 60.19 kg/m. Last weight:  Wt Readings from Last 3 Encounters:  05/11/23 298 lb (135.2 kg)  04/12/23 292 lb (132.5 kg)  03/05/23 279 lb (126.6 kg)     Physical Exam Constitutional:      Appearance: Normal appearance.  Cardiovascular:     Rate and Rhythm: Normal rate and regular rhythm.     Heart sounds: Normal heart sounds.  Pulmonary:     Effort: Pulmonary effort is normal.     Breath sounds: Normal breath sounds.  Musculoskeletal:     Right lower leg: No edema.     Left lower leg: No edema.  Neurological:     Mental Status: She is alert.       EKG:   Recent Labs: 01/09/2023: Hemoglobin 9.6; Platelets 302    Lipid Panel No results found for: "CHOL", "TRIG", "HDL", "CHOLHDL", "VLDL", "LDLCALC", "LDLDIRECT"    Other studies Reviewed: Additional studies/ records that were reviewed today include:  Review of the above records demonstrates:        No data to display            ASSESSMENT AND PLAN:    ICD-10-CM   1. Mixed hyperlipidemia  E78.2 Basic metabolic panel   has caroted  disease and go to [facility]    2. Primary hypertension  I10 CT ANGIO HEAD NECK W WO CM    Basic metabolic panel    3. OSA on CPAP  G47.33 CT ANGIO HEAD NECK W WO CM    Basic metabolic panel    4. Morbid obesity (HCC)  E66.01 CT ANGIO HEAD NECK W WO CM    Basic metabolic panel    5. Carotid stenosis, asymptomatic, bilateral  I65.23 CT ANGIO HEAD NECK W WO CM    Basic metabolic panel   caroted right side 50-69, left 70-79 percent, advise cta caroteds       Problem List Items Addressed This Visit       Cardiovascular and Mediastinum   Hypertension   Relevant Medications   rosuvastatin (CRESTOR) 40 MG tablet   Other Relevant Orders   CT ANGIO HEAD NECK W WO CM   Basic metabolic panel     Respiratory   OSA on CPAP   Relevant Orders   CT ANGIO HEAD NECK W WO CM   Basic metabolic panel     Other   Morbid obesity (HCC)   Relevant Orders   CT ANGIO HEAD NECK W WO CM   Basic metabolic panel   Other Visit Diagnoses     Mixed hyperlipidemia    -  Primary   has caroted disease and go to [facility]   Relevant Medications   rosuvastatin (CRESTOR) 40 MG tablet   Other Relevant Orders   Basic metabolic panel   Carotid stenosis, asymptomatic, bilateral       caroted right side 50-69, left 70-79 percent, advise cta caroteds   Relevant Medications   rosuvastatin (CRESTOR) 40 MG tablet   Other Relevant Orders   CT ANGIO HEAD NECK W WO CM   Basic metabolic panel          Disposition:   No follow-ups on file.    Total time spent: 30 minutes  Signed,  Adrian Blackwater, MD  05/11/2023 9:26 AM    [facility]

## 2023-05-14 ENCOUNTER — Other Ambulatory Visit: Payer: Medicare Other

## 2023-05-15 LAB — BASIC METABOLIC PANEL
BUN/Creatinine Ratio: 31 — ABNORMAL HIGH (ref 12–28)
BUN: 32 mg/dL — ABNORMAL HIGH (ref 8–27)
CO2: 22 mmol/L (ref 20–29)
Calcium: 8.9 mg/dL (ref 8.7–10.3)
Chloride: 106 mmol/L (ref 96–106)
Creatinine, Ser: 1.03 mg/dL — ABNORMAL HIGH (ref 0.57–1.00)
Glucose: 111 mg/dL — ABNORMAL HIGH (ref 70–99)
Potassium: 4.4 mmol/L (ref 3.5–5.2)
Sodium: 143 mmol/L (ref 134–144)
eGFR: 59 mL/min/{1.73_m2} — ABNORMAL LOW (ref >59)

## 2023-05-16 ENCOUNTER — Encounter: Payer: Self-pay | Admitting: Internal Medicine

## 2023-05-20 ENCOUNTER — Other Ambulatory Visit: Payer: Self-pay | Admitting: Cardiovascular Disease

## 2023-05-20 DIAGNOSIS — I739 Peripheral vascular disease, unspecified: Secondary | ICD-10-CM

## 2023-05-21 ENCOUNTER — Ambulatory Visit (INDEPENDENT_AMBULATORY_CARE_PROVIDER_SITE_OTHER): Payer: Medicare Other

## 2023-05-21 DIAGNOSIS — I6523 Occlusion and stenosis of bilateral carotid arteries: Secondary | ICD-10-CM

## 2023-05-21 DIAGNOSIS — I10 Essential (primary) hypertension: Secondary | ICD-10-CM

## 2023-05-21 DIAGNOSIS — G4733 Obstructive sleep apnea (adult) (pediatric): Secondary | ICD-10-CM | POA: Diagnosis not present

## 2023-05-21 MED ORDER — IOHEXOL 350 MG/ML SOLN
100.0000 mL | Freq: Once | INTRAVENOUS | Status: AC | PRN
Start: 2023-05-21 — End: 2023-05-21
  Administered 2023-05-21: 100 mL via INTRAVENOUS

## 2023-05-23 ENCOUNTER — Other Ambulatory Visit: Payer: Self-pay | Admitting: Cardiovascular Disease

## 2023-05-23 DIAGNOSIS — I10 Essential (primary) hypertension: Secondary | ICD-10-CM

## 2023-06-01 ENCOUNTER — Other Ambulatory Visit: Payer: Self-pay | Admitting: Family Medicine

## 2023-06-01 DIAGNOSIS — Z1231 Encounter for screening mammogram for malignant neoplasm of breast: Secondary | ICD-10-CM

## 2023-06-08 ENCOUNTER — Other Ambulatory Visit: Payer: Self-pay | Admitting: Cardiovascular Disease

## 2023-06-11 ENCOUNTER — Encounter: Payer: Self-pay | Admitting: Cardiovascular Disease

## 2023-06-11 ENCOUNTER — Ambulatory Visit (INDEPENDENT_AMBULATORY_CARE_PROVIDER_SITE_OTHER): Payer: Medicare Other | Admitting: Cardiology

## 2023-06-11 VITALS — BP 150/60 | HR 78 | Ht 59.0 in | Wt 290.0 lb

## 2023-06-11 DIAGNOSIS — I10 Essential (primary) hypertension: Secondary | ICD-10-CM | POA: Diagnosis not present

## 2023-06-11 DIAGNOSIS — I739 Peripheral vascular disease, unspecified: Secondary | ICD-10-CM | POA: Insufficient documentation

## 2023-06-11 DIAGNOSIS — E782 Mixed hyperlipidemia: Secondary | ICD-10-CM | POA: Diagnosis not present

## 2023-06-11 NOTE — Progress Notes (Signed)
Cardiology Office Note   Date:  06/11/2023   ID:  Julie Sawyer, DOB 1956-05-19, MRN [id number]  PCP:  Emogene Morgan, MD  Cardiologist:  Marisue Ivan, NP      History of Present Illness: Julie Sawyer is a 67 y.o. female who presents for  Chief Complaint  Patient presents with   Follow-up    1 month follow up    Patient in office for 1 month follow up. Discuss results of CTA carotids. Patient feeling well. No complaints today.      Past Medical History:  Diagnosis Date   Anemia    Arthritis    Bursitis    Coronary artery disease    Diabetes mellitus without complication (HCC)    Elevated lipids    Gout    Hypertension    Neuropathy    Obesity    Shortness of breath dyspnea    Sleep apnea    use CPAP   Vaginal bleeding      Past Surgical History:  Procedure Laterality Date   BREAST BIOPSY Left    neg   BREAST SURGERY Left    CATARACT EXTRACTION W/PHACO Right 06/03/2020   Procedure: CATARACT EXTRACTION PHACO AND INTRAOCULAR LENS PLACEMENT (IOC) RIGHT DIABETIC;  Surgeon: Julie Cousin, MD;  Location: [facility];  Service: Ophthalmology;  Laterality: Right;  Lot # [id number] H US;00:20.0 CDE: 2.69   CATARACT EXTRACTION W/PHACO Left 07/23/2020   Procedure: CATARACT EXTRACTION PHACO AND INTRAOCULAR LENS PLACEMENT (IOC) LEFT VISION BLUE DIABETIC;  Surgeon: Julie Cousin, MD;  Location: [facility];  Service: Ophthalmology;  Laterality: Left;  [place] 00:17.5 CDE 2.97 AP% 8.2 Fluid Pack Lot # [id number]   CESAREAN SECTION  1989   DILATATION & CURETTAGE/HYSTEROSCOPY WITH MYOSURE N/A 08/28/2017   Procedure: DILATATION & CURETTAGE/HYSTEROSCOPY WITH MYOSURE;  Surgeon: Julie Novak, MD;  Location: [facility];  Service: Gynecology;  Laterality: N/A;   DILATION AND CURETTAGE OF UTERUS     ENDOMETRIAL ABLATION N/A 08/28/2017   Procedure: ENDOMETRIAL POLYPECTOMY;  Surgeon: Julie Novak, MD;  Location: [facility];  Service: Gynecology;  Laterality: N/A;   TONSILLECTOMY      TUBAL LIGATION       Current Outpatient Medications  Medication Sig Dispense Refill   aspirin EC 81 MG tablet Take 81 mg by mouth daily. Swallow whole.     azelastine (ASTELIN) 0.1 % nasal spray Place into both nostrils.     carvedilol (COREG) 6.25 MG tablet Take 1 tablet (6.25 mg total) by mouth 2 (two) times daily. 180 tablet 3   cloNIDine (CATAPRES) 0.1 MG tablet TAKE 1 TABLET BY MOUTH TWICE DAILY 180 tablet 0   cyclobenzaprine (FLEXERIL) 5 MG tablet Take 5 mg by mouth at bedtime as needed.     furosemide (LASIX) 20 MG tablet TAKE 1 TABLET(20 MG) BY MOUTH TWICE DAILY 60 tablet 0   gabapentin (NEURONTIN) 300 MG capsule Take by mouth.     hydrALAZINE (APRESOLINE) 100 MG tablet TAKE 1 TABLET BY MOUTH THREE TIMES DAILY 270 tablet 1   indomethacin (INDOCIN) 50 MG capsule Take 50 mg by mouth 2 (two) times daily as needed for moderate pain (pain (gout)).  0   LANTUS SOLOSTAR 100 UNIT/ML Solostar Pen SMARTSIG:60 Unit(s) SUB-Q Twice Daily     levocetirizine (XYZAL) 5 MG tablet Take 1 tablet by mouth daily.     losartan (COZAAR) 100 MG tablet Take 100 mg by mouth daily.     OZEMPIC, 0.25 OR 0.5  MG/DOSE, 2 MG/3ML SOPN Inject into the skin.     pentoxifylline (TRENTAL) 400 MG CR tablet TAKE 1 TABLET BY MOUTH EVERY DAY 30 tablet 3   rosuvastatin (CRESTOR) 40 MG tablet Take 1 tablet (40 mg total) by mouth daily. 30 tablet 2   vitamin B-12 (CYANOCOBALAMIN) 100 MCG tablet Take 100 mcg by mouth daily.     No current facility-administered medications for this visit.    Allergies:   Penicillins    Social History:   reports that she quit smoking about 25 years ago. Her smoking use included cigarettes. She started smoking about 45 years ago. She has a 15 pack-year smoking history. She has never used smokeless tobacco. She reports that she does not drink alcohol and does not use drugs.   Family History:  family history is not on file. She was adopted.    ROS:     Review of Systems   Constitutional: Negative.   HENT: Negative.    Eyes: Negative.   Respiratory: Negative.    Cardiovascular: Negative.   Gastrointestinal: Negative.   Genitourinary: Negative.   Musculoskeletal: Negative.   Skin: Negative.   Neurological: Negative.   Endo/Heme/Allergies: Negative.   Psychiatric/Behavioral: Negative.    All other systems reviewed and are negative.     All other systems are reviewed and negative.    PHYSICAL EXAM: VS:  BP (!) 150/60   Pulse 78   Ht 4\' 11"  (1.499 m)   Wt 290 lb (131.5 kg)   SpO2 96%   BMI 58.57 kg/m  , BMI Body mass index is 58.57 kg/m. Last weight:  Wt Readings from Last 3 Encounters:  06/11/23 290 lb (131.5 kg)  05/11/23 298 lb (135.2 kg)  04/12/23 292 lb (132.5 kg)     Physical Exam Vitals and nursing note reviewed.  Constitutional:      Appearance: Normal appearance. She is normal weight.  HENT:     Head: Normocephalic and atraumatic.     Nose: Nose normal.     Mouth/Throat:     Mouth: Mucous membranes are moist.     Pharynx: Oropharynx is clear.  Eyes:     Conjunctiva/sclera: Conjunctivae normal.     Pupils: Pupils are equal, round, and reactive to light.  Cardiovascular:     Rate and Rhythm: Normal rate and regular rhythm.     Pulses: Normal pulses.     Heart sounds: Normal heart sounds.  Pulmonary:     Effort: Pulmonary effort is normal.     Breath sounds: Normal breath sounds.  Abdominal:     General: Abdomen is flat. Bowel sounds are normal.     Palpations: Abdomen is soft.  Musculoskeletal:        General: Normal range of motion.     Cervical back: Normal range of motion.  Skin:    General: Skin is warm and dry.  Neurological:     General: No focal deficit present.     Mental Status: She is alert and oriented to person, place, and time. Mental status is at baseline.  Psychiatric:        Mood and Affect: Mood normal.        Behavior: Behavior normal.     EKG: none today  Recent Labs: 01/09/2023:  Hemoglobin 9.6; Platelets 302 05/14/2023: BUN 32; Creatinine, Ser 1.03; Potassium 4.4; Sodium 143    Lipid Panel No results found for: "CHOL", "TRIG", "HDL", "CHOLHDL", "VLDL", "LDLCALC", "LDLDIRECT"    ASSESSMENT AND PLAN:  ICD-10-CM   1. PVD (peripheral vascular disease) (HCC)  I73.9 MR Angiogram Neck W Wo Contrast    2. Primary hypertension  I10     3. Morbid obesity (HCC)  E66.01     4. Mixed hyperlipidemia  E78.2        Problem List Items Addressed This Visit       Cardiovascular and Mediastinum   Hypertension   PVD (peripheral vascular disease) (HCC) - Primary    CTA carotids technically inadequate for evaluation of the common carotid arteries secondary to the patient's large body habitus and extensive calcified atherosclerotic plaque. Radiology recommends MRI examination or intra arterial CTA examination. MRI carotids ordered.       Relevant Orders   MR Angiogram Neck W Wo Contrast     Other   Morbid obesity (HCC)   Mixed hyperlipidemia     Disposition:   Return in about 4 weeks (around 07/09/2023).    Total time spent: 30 minutes  Signed,  Google, NP  06/11/2023 10:19 AM    [facility]

## 2023-06-11 NOTE — Assessment & Plan Note (Signed)
CTA carotids technically inadequate for evaluation of the common carotid arteries secondary to the patient's large body habitus and extensive calcified atherosclerotic plaque. Radiology recommends MRI examination or intra arterial CTA examination. MRI carotids ordered.

## 2023-06-13 ENCOUNTER — Other Ambulatory Visit: Payer: Self-pay | Admitting: Family Medicine

## 2023-07-09 ENCOUNTER — Other Ambulatory Visit: Payer: Self-pay

## 2023-07-09 ENCOUNTER — Other Ambulatory Visit: Payer: Self-pay | Admitting: Cardiovascular Disease

## 2023-07-09 DIAGNOSIS — D649 Anemia, unspecified: Secondary | ICD-10-CM

## 2023-07-09 DIAGNOSIS — E538 Deficiency of other specified B group vitamins: Secondary | ICD-10-CM

## 2023-07-10 ENCOUNTER — Inpatient Hospital Stay: Payer: Medicare Other

## 2023-07-10 ENCOUNTER — Inpatient Hospital Stay: Payer: Medicare Other | Admitting: Internal Medicine

## 2023-07-10 ENCOUNTER — Inpatient Hospital Stay: Payer: Medicare Other | Attending: Internal Medicine

## 2023-07-10 VITALS — BP 130/47 | HR 72 | Temp 97.8°F | Wt 294.0 lb

## 2023-07-10 DIAGNOSIS — Z7982 Long term (current) use of aspirin: Secondary | ICD-10-CM | POA: Diagnosis not present

## 2023-07-10 DIAGNOSIS — D649 Anemia, unspecified: Secondary | ICD-10-CM | POA: Insufficient documentation

## 2023-07-10 DIAGNOSIS — E538 Deficiency of other specified B group vitamins: Secondary | ICD-10-CM

## 2023-07-10 DIAGNOSIS — I129 Hypertensive chronic kidney disease with stage 1 through stage 4 chronic kidney disease, or unspecified chronic kidney disease: Secondary | ICD-10-CM | POA: Diagnosis not present

## 2023-07-10 DIAGNOSIS — N183 Chronic kidney disease, stage 3 unspecified: Secondary | ICD-10-CM | POA: Diagnosis not present

## 2023-07-10 DIAGNOSIS — E1122 Type 2 diabetes mellitus with diabetic chronic kidney disease: Secondary | ICD-10-CM | POA: Diagnosis not present

## 2023-07-10 DIAGNOSIS — Z794 Long term (current) use of insulin: Secondary | ICD-10-CM | POA: Insufficient documentation

## 2023-07-10 DIAGNOSIS — D563 Thalassemia minor: Secondary | ICD-10-CM | POA: Insufficient documentation

## 2023-07-10 DIAGNOSIS — Z87891 Personal history of nicotine dependence: Secondary | ICD-10-CM | POA: Diagnosis not present

## 2023-07-10 DIAGNOSIS — Z79899 Other long term (current) drug therapy: Secondary | ICD-10-CM | POA: Insufficient documentation

## 2023-07-10 DIAGNOSIS — N1831 Chronic kidney disease, stage 3a: Secondary | ICD-10-CM | POA: Diagnosis not present

## 2023-07-10 LAB — FERRITIN: Ferritin: 356 ng/mL — ABNORMAL HIGH (ref 11–307)

## 2023-07-10 LAB — CBC WITH DIFFERENTIAL/PLATELET
Abs Immature Granulocytes: 0.05 10*3/uL (ref 0.00–0.07)
Basophils Absolute: 0.1 10*3/uL (ref 0.0–0.1)
Basophils Relative: 1 %
Eosinophils Absolute: 0.2 10*3/uL (ref 0.0–0.5)
Eosinophils Relative: 2 %
HCT: 31.8 % — ABNORMAL LOW (ref 36.0–46.0)
Hemoglobin: 9.5 g/dL — ABNORMAL LOW (ref 12.0–15.0)
Immature Granulocytes: 0 %
Lymphocytes Relative: 31 %
Lymphs Abs: 3.9 10*3/uL (ref 0.7–4.0)
MCH: 21.4 pg — ABNORMAL LOW (ref 26.0–34.0)
MCHC: 29.9 g/dL — ABNORMAL LOW (ref 30.0–36.0)
MCV: 71.8 fL — ABNORMAL LOW (ref 80.0–100.0)
Monocytes Absolute: 1 10*3/uL (ref 0.1–1.0)
Monocytes Relative: 8 %
Neutro Abs: 7.1 10*3/uL (ref 1.7–7.7)
Neutrophils Relative %: 58 %
Platelets: 279 10*3/uL (ref 150–400)
RBC: 4.43 MIL/uL (ref 3.87–5.11)
RDW: 17.8 % — ABNORMAL HIGH (ref 11.5–15.5)
WBC: 12.4 10*3/uL — ABNORMAL HIGH (ref 4.0–10.5)
nRBC: 0 % (ref 0.0–0.2)

## 2023-07-10 LAB — IRON AND TIBC
Iron: 49 ug/dL (ref 28–170)
Saturation Ratios: 20 % (ref 10.4–31.8)
TIBC: 248 ug/dL — ABNORMAL LOW (ref 250–450)
UIBC: 199 ug/dL

## 2023-07-10 LAB — FOLATE: Folate: 10.2 ng/mL (ref >5.9)

## 2023-07-10 NOTE — Progress Notes (Signed)
[facility]  [phone] [phone]  ID: SAJDA TIER OB: Jul 27, 1956  MR#: [id number]  ION#:[id number]  [name], MD as PCP - General (Family Medicine)  REASON FOR REFERRAL: anemia, vitamin B12 deficiency  HPI: Julie Sawyer is a 67 y.o. female with past medical history of hypertension, CAD, diabetes, anemia, OSA on CPAP was referred to hematology for work-up of anemia.  Patient reports longstanding history of anemia.  On lab review, dating back to 2014 patient baseline hemoglobin has been between 9-10.  With MCV of 69-71.  Platelets are normal and WBC normal.  Patient reports diagnosis of thalassemia in her son.  S/p vitamin B12 injection x 5.  Last dose in February 2024.  Interval history Patient seen today as follow-up for anemia, labs, vitamin B12 deficiency Overall she has been feeling well.  Denies any recent illnesses or hospitalizations.  Taking her B12 supplements.  REVIEW OF SYSTEMS:   ROS  As per HPI. Otherwise, a complete review of systems is negative.  PAST MEDICAL HISTORY: Past Medical History:  Diagnosis Date   Anemia    Arthritis    Bursitis    Coronary artery disease    Diabetes mellitus without complication (HCC)    Elevated lipids    Gout    Hypertension    Neuropathy    Obesity    Shortness of breath dyspnea    Sleep apnea    use CPAP   Vaginal bleeding     PAST SURGICAL HISTORY: Past Surgical History:  Procedure Laterality Date   BREAST BIOPSY Left    neg   BREAST SURGERY Left    CATARACT EXTRACTION W/PHACO Right 06/03/2020   Procedure: CATARACT EXTRACTION PHACO AND INTRAOCULAR LENS PLACEMENT (IOC) RIGHT DIABETIC;  Surgeon: Elliot Cousin, MD;  Location: [facility];  Service: Ophthalmology;  Laterality: Right;  Lot # [id number] H US;00:20.0 CDE: 2.69   CATARACT EXTRACTION W/PHACO Left 07/23/2020   Procedure: CATARACT EXTRACTION PHACO AND INTRAOCULAR LENS PLACEMENT (IOC) LEFT VISION  BLUE DIABETIC;  Surgeon: Elliot Cousin, MD;  Location: [facility];  Service: Ophthalmology;  Laterality: Left;  [place] 00:17.5 CDE 2.97 AP% 8.2 Fluid Pack Lot # [id number] H   CESAREAN SECTION  1989   DILATATION & CURETTAGE/HYSTEROSCOPY WITH MYOSURE N/A 08/28/2017   Procedure: DILATATION & CURETTAGE/HYSTEROSCOPY WITH MYOSURE;  Surgeon: Conard Novak, MD;  Location: [facility];  Service: Gynecology;  Laterality: N/A;   DILATION AND CURETTAGE OF UTERUS     ENDOMETRIAL ABLATION N/A 08/28/2017   Procedure: ENDOMETRIAL POLYPECTOMY;  Surgeon: Conard Novak, MD;  Location: [facility];  Service: Gynecology;  Laterality: N/A;   TONSILLECTOMY     TUBAL LIGATION      FAMILY HISTORY: Family History  Adopted: Yes    HEALTH MAINTENANCE: Social History   Tobacco Use   Smoking status: Former    Current packs/day: 0.00    Average packs/day: 0.8 packs/day for 20.0 years (15.0 ttl pk-yrs)    Types: Cigarettes    Start date: 11/13/1977    Quit date: 11/13/1997    Years since quitting: 25.6   Smokeless tobacco: Never  Vaping Use   Vaping status: Never Used  Substance Use Topics   Alcohol use: No   Drug use: No     Allergies  Allergen Reactions   Penicillins Other (See Comments)    Childhood reaction.    Current Outpatient Medications  Medication Sig Dispense Refill   aspirin EC 81 MG tablet Take 81  mg by mouth daily. Swallow whole.     azelastine (ASTELIN) 0.1 % nasal spray Place into both nostrils.     carvedilol (COREG) 6.25 MG tablet Take 1 tablet (6.25 mg total) by mouth 2 (two) times daily. 180 tablet 3   cloNIDine (CATAPRES) 0.1 MG tablet TAKE 1 TABLET BY MOUTH TWICE DAILY 180 tablet 0   cyclobenzaprine (FLEXERIL) 5 MG tablet Take 5 mg by mouth at bedtime as needed.     furosemide (LASIX) 20 MG tablet TAKE 1 TABLET(20 MG) BY MOUTH TWICE DAILY 60 tablet 0   gabapentin (NEURONTIN) 300 MG capsule Take by mouth.     hydrALAZINE (APRESOLINE) 100 MG tablet TAKE 1 TABLET BY MOUTH THREE TIMES  DAILY 270 tablet 1   indomethacin (INDOCIN) 50 MG capsule Take 50 mg by mouth 2 (two) times daily as needed for moderate pain (pain (gout)).  0   LANTUS SOLOSTAR 100 UNIT/ML Solostar Pen SMARTSIG:60 Unit(s) SUB-Q Twice Daily     levocetirizine (XYZAL) 5 MG tablet Take 1 tablet by mouth daily.     losartan (COZAAR) 100 MG tablet Take 100 mg by mouth daily.     OZEMPIC, 0.25 OR 0.5 MG/DOSE, 2 MG/3ML SOPN Inject into the skin.     pentoxifylline (TRENTAL) 400 MG CR tablet TAKE 1 TABLET BY MOUTH EVERY DAY 30 tablet 3   rosuvastatin (CRESTOR) 40 MG tablet Take 1 tablet (40 mg total) by mouth daily. 30 tablet 2   vitamin B-12 (CYANOCOBALAMIN) 100 MCG tablet Take 100 mcg by mouth daily.     No current facility-administered medications for this visit.    OBJECTIVE: Vitals:   07/10/23 0956  BP: (!) 130/47  Pulse: 72  Temp: 97.8 F (36.6 C)  SpO2: 97%      Body mass index is 59.38 kg/m.      General: Well-developed, well-nourished, no acute distress. Eyes: Pink conjunctiva, anicteric sclera. HEENT: Normocephalic, moist mucous membranes, clear oropharnyx. Lungs: Clear to auscultation bilaterally. Heart: Regular rate and rhythm. No rubs, murmurs, or gallops. Abdomen: Soft, nontender, nondistended. No organomegaly noted, normoactive bowel sounds. Musculoskeletal: No edema, cyanosis, or clubbing. Neuro: Alert, answering all questions appropriately. Cranial nerves grossly intact. Skin: No rashes or petechiae noted. Psych: Normal affect. Lymphatics: No cervical, calvicular, axillary or inguinal LAD.   LAB RESULTS:  Lab Results  Component Value Date   NA 143 05/14/2023   K 4.4 05/14/2023   CL 106 05/14/2023   CO2 22 05/14/2023   GLUCOSE 111 (H) 05/14/2023   BUN 32 (H) 05/14/2023   CREATININE 1.03 (H) 05/14/2023   CALCIUM 8.9 05/14/2023   PROT 7.6 07/31/2017   ALBUMIN 3.7 07/31/2017   AST 20 07/31/2017   ALT 17 07/31/2017   ALKPHOS 62 07/31/2017   BILITOT 0.3 07/31/2017    GFRNONAA >60 07/31/2017   GFRAA >60 07/31/2017    Lab Results  Component Value Date   WBC 12.4 (H) 07/10/2023   NEUTROABS 7.1 07/10/2023   HGB 9.5 (L) 07/10/2023   HCT 31.8 (L) 07/10/2023   MCV 71.8 (L) 07/10/2023   PLT 279 07/10/2023    Lab Results  Component Value Date   TIBC 248 (L) 07/10/2023   TIBC 263 09/25/2022   FERRITIN 356 (H) 07/10/2023   FERRITIN 434 (H) 09/25/2022   IRONPCTSAT 20 07/10/2023   IRONPCTSAT 22 09/25/2022     STUDIES: No results found.  ASSESSMENT AND PLAN:   CLEOTHA RICHARD is a 67 y.o. 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  Patient also has CKD stage III from her chronic diabetes and hypertension which is also contributing to her anemia. -She is maintaining her hemoglobin well between 9-10.  I will also check for myeloma panel and EPO level today.  I discussed with her about EPO injections if her hemoglobin trends down below 9.  At this time she is asymptomatic and I will hold off on EPO.  # Severe B12 deficiency -B12 from 09/26/2022 141. S/p IM B12 injection last in February 2024.  Now on B12 supplements 1000 mcg daily. -B12 level from February is not accurate.  This was drawn just after she received a B12 injection so it is falsely elevated. -I have recheck level today which is pending.  If level is low despite being on B12 supplements I will schedule her for injections.  Orders Placed This Encounter  Procedures   CBC with Differential/Platelet   Multiple Myeloma Panel (SPEP&IFE w/QIG)   Kappa/lambda light chains   Erythropoietin   Iron and TIBC([facility])   Ferritin   Vitamin B12   Folate   CBC with Differential (Cancer Center Only)   CBC with Differential/Platelet   Vitamin B12   Comprehensive metabolic panel    RTC in 6 months for MD visit,  labs.  Patient expressed understanding and was in agreement with this plan. She also understands that She can call clinic at any time with any questions, concerns, or complaints.   I spent a total of 30 minutes reviewing chart data, face-to-face evaluation with the patient, counseling and coordination of care as detailed above.  [name], MD   07/10/2023 1:22 PM

## 2023-07-10 NOTE — Progress Notes (Signed)
Patient says that she has increased her fiber, and she thinks that might be what caused her loose stools which started last week.

## 2023-07-11 LAB — KAPPA/LAMBDA LIGHT CHAINS
Kappa free light chain: 53.3 mg/L — ABNORMAL HIGH (ref 3.3–19.4)
Kappa, lambda light chain ratio: 1.86 — ABNORMAL HIGH (ref 0.26–1.65)
Lambda free light chains: 28.7 mg/L — ABNORMAL HIGH (ref 5.7–26.3)

## 2023-07-11 LAB — VITAMIN B12: Vitamin B-12: 1920 pg/mL — ABNORMAL HIGH (ref 180–914)

## 2023-07-11 LAB — ERYTHROPOIETIN: Erythropoietin: 15.9 m[IU]/mL (ref 2.6–18.5)

## 2023-07-12 ENCOUNTER — Encounter: Payer: Self-pay | Admitting: Internal Medicine

## 2023-07-12 ENCOUNTER — Encounter: Payer: Self-pay | Admitting: Cardiovascular Disease

## 2023-07-12 ENCOUNTER — Ambulatory Visit: Payer: Medicare Other | Admitting: Cardiovascular Disease

## 2023-07-12 VITALS — BP 140/60 | HR 76 | Ht 59.0 in | Wt 288.8 lb

## 2023-07-12 DIAGNOSIS — I739 Peripheral vascular disease, unspecified: Secondary | ICD-10-CM | POA: Diagnosis not present

## 2023-07-12 DIAGNOSIS — I10 Essential (primary) hypertension: Secondary | ICD-10-CM

## 2023-07-12 DIAGNOSIS — R0602 Shortness of breath: Secondary | ICD-10-CM

## 2023-07-12 DIAGNOSIS — I2585 Chronic coronary microvascular dysfunction: Secondary | ICD-10-CM

## 2023-07-12 DIAGNOSIS — E782 Mixed hyperlipidemia: Secondary | ICD-10-CM | POA: Diagnosis not present

## 2023-07-12 NOTE — Progress Notes (Signed)
Cardiology Office Note   Date:  07/12/2023   ID:  Melodey, [place] 02-21-1956, MRN [id number]  PCP:  Emogene Morgan, MD  Cardiologist:  Adrian Blackwater, MD      History of Present Illness: Julie Sawyer is a 67 y.o. female who presents for  Chief Complaint  Patient presents with   Follow-up    1 mo f/u    Feels better      Past Medical History:  Diagnosis Date   Anemia    Arthritis    Bursitis    Coronary artery disease    Diabetes mellitus without complication (HCC)    Elevated lipids    Gout    Hypertension    Neuropathy    Obesity    Shortness of breath dyspnea    Sleep apnea    use CPAP   Vaginal bleeding      Past Surgical History:  Procedure Laterality Date   BREAST BIOPSY Left    neg   BREAST SURGERY Left    CATARACT EXTRACTION W/PHACO Right 06/03/2020   Procedure: CATARACT EXTRACTION PHACO AND INTRAOCULAR LENS PLACEMENT (IOC) RIGHT DIABETIC;  Surgeon: Elliot Cousin, MD;  Location: [facility];  Service: Ophthalmology;  Laterality: Right;  Lot # [id number] H US;00:20.0 CDE: 2.69   CATARACT EXTRACTION W/PHACO Left 07/23/2020   Procedure: CATARACT EXTRACTION PHACO AND INTRAOCULAR LENS PLACEMENT (IOC) LEFT VISION BLUE DIABETIC;  Surgeon: Elliot Cousin, MD;  Location: [facility];  Service: Ophthalmology;  Laterality: Left;  [place] 00:17.5 CDE 2.97 AP% 8.2 Fluid Pack Lot # [id number]   CESAREAN SECTION  1989   DILATATION & CURETTAGE/HYSTEROSCOPY WITH MYOSURE N/A 08/28/2017   Procedure: DILATATION & CURETTAGE/HYSTEROSCOPY WITH MYOSURE;  Surgeon: Conard Novak, MD;  Location: [facility];  Service: Gynecology;  Laterality: N/A;   DILATION AND CURETTAGE OF UTERUS     ENDOMETRIAL ABLATION N/A 08/28/2017   Procedure: ENDOMETRIAL POLYPECTOMY;  Surgeon: Conard Novak, MD;  Location: [facility];  Service: Gynecology;  Laterality: N/A;   TONSILLECTOMY     TUBAL LIGATION       Current Outpatient Medications  Medication Sig Dispense Refill   aspirin EC 81 MG  tablet Take 81 mg by mouth daily. Swallow whole.     azelastine (ASTELIN) 0.1 % nasal spray Place into both nostrils.     carvedilol (COREG) 6.25 MG tablet Take 1 tablet (6.25 mg total) by mouth 2 (two) times daily. 180 tablet 3   cloNIDine (CATAPRES) 0.1 MG tablet TAKE 1 TABLET BY MOUTH TWICE DAILY 180 tablet 0   cyclobenzaprine (FLEXERIL) 5 MG tablet Take 5 mg by mouth at bedtime as needed.     furosemide (LASIX) 20 MG tablet TAKE 1 TABLET(20 MG) BY MOUTH TWICE DAILY 60 tablet 0   gabapentin (NEURONTIN) 300 MG capsule Take by mouth.     hydrALAZINE (APRESOLINE) 100 MG tablet TAKE 1 TABLET BY MOUTH THREE TIMES DAILY 270 tablet 1   indomethacin (INDOCIN) 50 MG capsule Take 50 mg by mouth 2 (two) times daily as needed for moderate pain (pain (gout)).  0   LANTUS SOLOSTAR 100 UNIT/ML Solostar Pen SMARTSIG:60 Unit(s) SUB-Q Twice Daily     levocetirizine (XYZAL) 5 MG tablet Take 1 tablet by mouth daily.     losartan (COZAAR) 100 MG tablet Take 100 mg by mouth daily.     OZEMPIC, 0.25 OR 0.5 MG/DOSE, 2 MG/3ML SOPN Inject into the skin.     pentoxifylline (TRENTAL) 400 MG CR tablet  TAKE 1 TABLET BY MOUTH EVERY DAY 30 tablet 3   rosuvastatin (CRESTOR) 40 MG tablet Take 1 tablet (40 mg total) by mouth daily. 30 tablet 2   vitamin B-12 (CYANOCOBALAMIN) 100 MCG tablet Take 100 mcg by mouth daily.     No current facility-administered medications for this visit.    Allergies:   Penicillins    Social History:   reports that she quit smoking about 25 years ago. Her smoking use included cigarettes. She started smoking about 45 years ago. She has a 15 pack-year smoking history. She has never used smokeless tobacco. She reports that she does not drink alcohol and does not use drugs.   Family History:  family history is not on file. She was adopted.    ROS:     Review of Systems  Constitutional: Negative.   HENT: Negative.    Eyes: Negative.   Respiratory: Negative.    Gastrointestinal: Negative.    Genitourinary: Negative.   Musculoskeletal: Negative.   Skin: Negative.   Neurological: Negative.   Endo/Heme/Allergies: Negative.   Psychiatric/Behavioral: Negative.    All other systems reviewed and are negative.     All other systems are reviewed and negative.    PHYSICAL EXAM: VS:  BP (!) 140/60   Pulse 76   Ht 4\' 11"  (1.499 m)   Wt 288 lb 12.8 oz (131 kg)   SpO2 97%   BMI 58.33 kg/m  , BMI Body mass index is 58.33 kg/m. Last weight:  Wt Readings from Last 3 Encounters:  07/12/23 288 lb 12.8 oz (131 kg)  07/10/23 294 lb (133.4 kg)  06/11/23 290 lb (131.5 kg)     Physical Exam Constitutional:      Appearance: Normal appearance.  Cardiovascular:     Rate and Rhythm: Normal rate and regular rhythm.     Heart sounds: Normal heart sounds.  Pulmonary:     Effort: Pulmonary effort is normal.     Breath sounds: Normal breath sounds.  Musculoskeletal:     Right lower leg: No edema.     Left lower leg: No edema.  Neurological:     Mental Status: She is alert.       EKG:   Recent Labs: 05/14/2023: BUN 32; Creatinine, Ser 1.03; Potassium 4.4; Sodium 143 07/10/2023: Hemoglobin 9.5; Platelets 279    Lipid Panel No results found for: "CHOL", "TRIG", "HDL", "CHOLHDL", "VLDL", "LDLCALC", "LDLDIRECT"    Other studies Reviewed: Additional studies/ records that were reviewed today include:  Review of the above records demonstrates:       No data to display            ASSESSMENT AND PLAN:    ICD-10-CM   1. Primary hypertension  I10 MYOCARDIAL PERFUSION IMAGING   Re[eat normal, as at home    2. PVD (peripheral vascular disease) (HCC)  I73.9 MYOCARDIAL PERFUSION IMAGING   No significant caroted disease, tortuasity there but study inadequate, no syncope    3. Mixed hyperlipidemia  E78.2 MYOCARDIAL PERFUSION IMAGING    4. Morbid obesity (HCC)  E66.01 MYOCARDIAL PERFUSION IMAGING    5. SOB (shortness of breath)  R06.02 MYOCARDIAL PERFUSION IMAGING   DOE,  advise stress test as has CAD, ca score 600 in 2020    6. Chronic coronary microvascular dysfunction  I25.85 MYOCARDIAL PERFUSION IMAGING       Problem List Items Addressed This Visit       Cardiovascular and Mediastinum   Hypertension - Primary  Relevant Orders   MYOCARDIAL PERFUSION IMAGING   PVD (peripheral vascular disease) (HCC)   Relevant Orders   MYOCARDIAL PERFUSION IMAGING     Other   Morbid obesity (HCC)   Relevant Orders   MYOCARDIAL PERFUSION IMAGING   Mixed hyperlipidemia   Relevant Orders   MYOCARDIAL PERFUSION IMAGING   Other Visit Diagnoses     SOB (shortness of breath)       DOE, advise stress test as has CAD, ca score 600 in 2020   Relevant Orders   MYOCARDIAL PERFUSION IMAGING   Chronic coronary microvascular dysfunction       Relevant Orders   MYOCARDIAL PERFUSION IMAGING          Disposition:   Return in about 2 weeks (around 07/26/2023) for stress test and f/u.    Total time spent: 35 minutes  Signed,  Adrian Blackwater, MD  07/12/2023 10:05 AM    [facility]

## 2023-07-16 LAB — MULTIPLE MYELOMA PANEL, SERUM
Albumin SerPl Elph-Mcnc: 3.4 g/dL (ref 2.9–4.4)
Albumin/Glob SerPl: 0.9 (ref 0.7–1.7)
Alpha 1: 0.2 g/dL (ref 0.0–0.4)
Alpha2 Glob SerPl Elph-Mcnc: 1 g/dL (ref 0.4–1.0)
B-Globulin SerPl Elph-Mcnc: 1.2 g/dL (ref 0.7–1.3)
Gamma Glob SerPl Elph-Mcnc: 1.5 g/dL (ref 0.4–1.8)
Globulin, Total: 3.8 g/dL (ref 2.2–3.9)
IgA: 418 mg/dL — ABNORMAL HIGH (ref 87–352)
IgG (Immunoglobin G), Serum: 1715 mg/dL — ABNORMAL HIGH (ref 586–1602)
IgM (Immunoglobulin M), Srm: 82 mg/dL (ref 26–217)
Total Protein ELP: 7.2 g/dL (ref 6.0–8.5)

## 2023-07-19 ENCOUNTER — Other Ambulatory Visit: Payer: Self-pay | Admitting: Cardiovascular Disease

## 2023-07-19 DIAGNOSIS — I10 Essential (primary) hypertension: Secondary | ICD-10-CM

## 2023-07-24 ENCOUNTER — Ambulatory Visit: Payer: Medicare Other | Admitting: Cardiovascular Disease

## 2023-08-02 ENCOUNTER — Ambulatory Visit
Admission: RE | Admit: 2023-08-02 | Discharge: 2023-08-02 | Disposition: A | Discharge status: Home / Self Care | Payer: Medicare Other | Source: Ambulatory Visit | Attending: Family Medicine | Admitting: Family Medicine

## 2023-08-02 DIAGNOSIS — Z1231 Encounter for screening mammogram for malignant neoplasm of breast: Secondary | ICD-10-CM | POA: Insufficient documentation

## 2023-08-06 ENCOUNTER — Other Ambulatory Visit: Payer: Self-pay | Admitting: Cardiovascular Disease

## 2023-08-17 ENCOUNTER — Other Ambulatory Visit: Payer: Self-pay | Admitting: Cardiovascular Disease

## 2023-08-17 DIAGNOSIS — I739 Peripheral vascular disease, unspecified: Secondary | ICD-10-CM

## 2023-09-03 ENCOUNTER — Other Ambulatory Visit: Payer: Self-pay | Admitting: Cardiovascular Disease

## 2023-09-18 ENCOUNTER — Telehealth: Payer: Self-pay

## 2023-09-18 NOTE — Telephone Encounter (Signed)
Patient LM asking for call back about gout, phone was static so hard to hear what patient needed exactly

## 2023-09-19 ENCOUNTER — Other Ambulatory Visit: Payer: Self-pay

## 2023-09-21 ENCOUNTER — Other Ambulatory Visit: Payer: Self-pay | Admitting: Cardiovascular Disease

## 2023-09-21 DIAGNOSIS — I739 Peripheral vascular disease, unspecified: Secondary | ICD-10-CM

## 2023-09-24 ENCOUNTER — Other Ambulatory Visit: Payer: Self-pay | Admitting: Cardiovascular Disease

## 2023-09-24 DIAGNOSIS — I739 Peripheral vascular disease, unspecified: Secondary | ICD-10-CM

## 2023-10-05 ENCOUNTER — Other Ambulatory Visit: Payer: Self-pay | Admitting: Cardiovascular Disease

## 2023-10-17 ENCOUNTER — Other Ambulatory Visit: Payer: Self-pay | Admitting: Cardiovascular Disease

## 2023-10-17 DIAGNOSIS — I10 Essential (primary) hypertension: Secondary | ICD-10-CM

## 2023-10-17 DIAGNOSIS — E782 Mixed hyperlipidemia: Secondary | ICD-10-CM

## 2023-10-30 ENCOUNTER — Other Ambulatory Visit: Payer: Self-pay | Admitting: Cardiovascular Disease

## 2023-11-08 ENCOUNTER — Other Ambulatory Visit: Payer: Self-pay | Admitting: Cardiovascular Disease

## 2023-11-08 MED ORDER — ROSUVASTATIN CALCIUM 40 MG PO TABS: 40.0000 mg | ORAL_TABLET | Freq: Every day | ORAL | 2 refills | Status: DC

## 2023-11-15 ENCOUNTER — Other Ambulatory Visit: Payer: Self-pay | Admitting: Cardiovascular Disease

## 2023-11-15 DIAGNOSIS — I739 Peripheral vascular disease, unspecified: Secondary | ICD-10-CM

## 2023-11-27 ENCOUNTER — Other Ambulatory Visit: Payer: Self-pay | Admitting: Cardiovascular Disease

## 2023-11-29 ENCOUNTER — Other Ambulatory Visit (INDEPENDENT_AMBULATORY_CARE_PROVIDER_SITE_OTHER): Payer: Self-pay | Admitting: Nurse Practitioner

## 2023-11-29 DIAGNOSIS — I77811 Abdominal aortic ectasia: Secondary | ICD-10-CM

## 2023-11-29 DIAGNOSIS — I739 Peripheral vascular disease, unspecified: Secondary | ICD-10-CM

## 2023-12-05 ENCOUNTER — Encounter (INDEPENDENT_AMBULATORY_CARE_PROVIDER_SITE_OTHER): Payer: Medicare Other

## 2023-12-05 ENCOUNTER — Ambulatory Visit (INDEPENDENT_AMBULATORY_CARE_PROVIDER_SITE_OTHER): Payer: Medicare Other | Admitting: Nurse Practitioner

## 2023-12-22 ENCOUNTER — Other Ambulatory Visit: Payer: Self-pay | Admitting: Cardiovascular Disease

## 2023-12-24 ENCOUNTER — Encounter: Payer: Self-pay | Admitting: Internal Medicine

## 2023-12-25 ENCOUNTER — Encounter: Payer: Self-pay | Admitting: Cardiovascular Disease

## 2023-12-25 ENCOUNTER — Encounter: Payer: Self-pay | Admitting: Internal Medicine

## 2023-12-25 ENCOUNTER — Ambulatory Visit (INDEPENDENT_AMBULATORY_CARE_PROVIDER_SITE_OTHER): Payer: Medicare Other | Admitting: Cardiovascular Disease

## 2023-12-25 VITALS — BP 142/78 | HR 76 | Ht 59.0 in | Wt 299.0 lb

## 2023-12-25 DIAGNOSIS — R609 Edema, unspecified: Secondary | ICD-10-CM

## 2023-12-25 DIAGNOSIS — I10 Essential (primary) hypertension: Secondary | ICD-10-CM

## 2023-12-25 DIAGNOSIS — I5033 Acute on chronic diastolic (congestive) heart failure: Secondary | ICD-10-CM

## 2023-12-25 DIAGNOSIS — E782 Mixed hyperlipidemia: Secondary | ICD-10-CM | POA: Diagnosis not present

## 2023-12-25 DIAGNOSIS — I739 Peripheral vascular disease, unspecified: Secondary | ICD-10-CM

## 2023-12-25 NOTE — Progress Notes (Signed)
Cardiology Office Note   Date:  12/25/2023   ID:  KAHLI MAYON, DOB 1956-10-10, MRN [id number]  PCP:  Emogene Morgan, MD  Cardiologist:  Adrian Blackwater, MD      History of Present Illness: Julie Sawyer is a 68 y.o. female who presents for  Chief Complaint  Patient presents with   Foot Swelling    swelling X3 days     Has SOB, retaing fluids, edema in feet      Past Medical History:  Diagnosis Date   Anemia    Arthritis    Bursitis    Coronary artery disease    Diabetes mellitus without complication (HCC)    Elevated lipids    Gout    Hypertension    Neuropathy    Obesity    Shortness of breath dyspnea    Sleep apnea    use CPAP   Vaginal bleeding      Past Surgical History:  Procedure Laterality Date   BREAST BIOPSY Left    neg   BREAST SURGERY Left    CATARACT EXTRACTION W/PHACO Right 06/03/2020   Procedure: CATARACT EXTRACTION PHACO AND INTRAOCULAR LENS PLACEMENT (IOC) RIGHT DIABETIC;  Surgeon: Elliot Cousin, MD;  Location: [facility];  Service: Ophthalmology;  Laterality: Right;  Lot # [id number] H US;00:20.0 CDE: 2.69   CATARACT EXTRACTION W/PHACO Left 07/23/2020   Procedure: CATARACT EXTRACTION PHACO AND INTRAOCULAR LENS PLACEMENT (IOC) LEFT VISION BLUE DIABETIC;  Surgeon: Elliot Cousin, MD;  Location: [facility];  Service: Ophthalmology;  Laterality: Left;  [place] 00:17.5 CDE 2.97 AP% 8.2 Fluid Pack Lot # [id number]   CESAREAN SECTION  1989   DILATATION & CURETTAGE/HYSTEROSCOPY WITH MYOSURE N/A 08/28/2017   Procedure: DILATATION & CURETTAGE/HYSTEROSCOPY WITH MYOSURE;  Surgeon: Conard Novak, MD;  Location: [facility];  Service: Gynecology;  Laterality: N/A;   DILATION AND CURETTAGE OF UTERUS     ENDOMETRIAL ABLATION N/A 08/28/2017   Procedure: ENDOMETRIAL POLYPECTOMY;  Surgeon: Conard Novak, MD;  Location: [facility];  Service: Gynecology;  Laterality: N/A;   TONSILLECTOMY     TUBAL LIGATION       Current Outpatient Medications  Medication Sig  Dispense Refill   aspirin EC 81 MG tablet Take 81 mg by mouth daily. Swallow whole.     azelastine (ASTELIN) 0.1 % nasal spray Place into both nostrils.     carvedilol (COREG) 6.25 MG tablet Take 1 tablet (6.25 mg total) by mouth 2 (two) times daily. 180 tablet 3   cloNIDine (CATAPRES) 0.1 MG tablet TAKE 1 TABLET BY MOUTH TWICE DAILY 180 tablet 0   cyclobenzaprine (FLEXERIL) 5 MG tablet Take 5 mg by mouth at bedtime as needed.     furosemide (LASIX) 20 MG tablet TAKE 1 TABLET(20 MG) BY MOUTH TWICE DAILY 60 tablet 0   gabapentin (NEURONTIN) 300 MG capsule Take by mouth.     hydrALAZINE (APRESOLINE) 100 MG tablet TAKE 1 TABLET BY MOUTH THREE TIMES DAILY 270 tablet 1   indomethacin (INDOCIN) 50 MG capsule Take 50 mg by mouth 2 (two) times daily as needed for moderate pain (pain (gout)).  0   LANTUS SOLOSTAR 100 UNIT/ML Solostar Pen SMARTSIG:60 Unit(s) SUB-Q Twice Daily     levocetirizine (XYZAL) 5 MG tablet Take 1 tablet by mouth daily.     losartan (COZAAR) 100 MG tablet Take 100 mg by mouth daily.     OZEMPIC, 0.25 OR 0.5 MG/DOSE, 2 MG/3ML SOPN Inject into the skin.  pentoxifylline (TRENTAL) 400 MG CR tablet TAKE 1 TABLET BY MOUTH EVERY DAY 30 tablet 3   pentoxifylline (TRENTAL) 400 MG CR tablet TAKE 1 TABLET BY MOUTH EVERY DAY 30 tablet 3   rosuvastatin (CRESTOR) 40 MG tablet Take 1 tablet (40 mg total) by mouth daily. 30 tablet 2   vitamin B-12 (CYANOCOBALAMIN) 100 MCG tablet Take 100 mcg by mouth daily.     No current facility-administered medications for this visit.    Allergies:   Penicillins    Social History:   reports that she quit smoking about 26 years ago. Her smoking use included cigarettes. She started smoking about 46 years ago. She has a 15 pack-year smoking history. She has never used smokeless tobacco. She reports that she does not drink alcohol and does not use drugs.   Family History:  family history is not on file. She was adopted.    ROS:     Review of Systems   Constitutional: Negative.   HENT: Negative.    Eyes: Negative.   Respiratory: Negative.    Gastrointestinal: Negative.   Genitourinary: Negative.   Musculoskeletal: Negative.   Skin: Negative.   Neurological: Negative.   Endo/Heme/Allergies: Negative.   Psychiatric/Behavioral: Negative.    All other systems reviewed and are negative.     All other systems are reviewed and negative.    PHYSICAL EXAM: VS:  BP (!) 142/78   Pulse 76   Ht 4\' 11"  (1.499 m)   Wt 299 lb (135.6 kg)   SpO2 94%   BMI 60.39 kg/m  , BMI Body mass index is 60.39 kg/m. Last weight:  Wt Readings from Last 3 Encounters:  12/25/23 299 lb (135.6 kg)  07/12/23 288 lb 12.8 oz (131 kg)  07/10/23 294 lb (133.4 kg)     Physical Exam Constitutional:      Appearance: Normal appearance.  Cardiovascular:     Rate and Rhythm: Normal rate and regular rhythm.     Heart sounds: Normal heart sounds.  Pulmonary:     Effort: Pulmonary effort is normal.     Breath sounds: Normal breath sounds.  Musculoskeletal:     Right lower leg: No edema.     Left lower leg: No edema.  Neurological:     Mental Status: She is alert.       EKG:   Recent Labs: 05/14/2023: BUN 32; Creatinine, Ser 1.03; Potassium 4.4; Sodium 143 07/10/2023: Hemoglobin 9.5; Platelets 279    Lipid Panel No results found for: "CHOL", "TRIG", "HDL", "CHOLHDL", "VLDL", "LDLCALC", "LDLDIRECT"    Other studies Reviewed: Additional studies/ records that were reviewed today include:  Review of the above records demonstrates:       No data to display            ASSESSMENT AND PLAN:    ICD-10-CM   1. Primary hypertension  I10 PCV ECHOCARDIOGRAM COMPLETE    2. PVD (peripheral vascular disease) (HCC)  I73.9 PCV ECHOCARDIOGRAM COMPLETE    3. Mixed hyperlipidemia  E78.2 PCV ECHOCARDIOGRAM COMPLETE    4. Morbid obesity (HCC)  E66.01 PCV ECHOCARDIOGRAM COMPLETE    5. Edema, unspecified type  R60.9 PCV ECHOCARDIOGRAM COMPLETE    6.  CHF (congestive heart failure), NYHA class III, acute on chronic, diastolic (HCC)  I50.33 PCV ECHOCARDIOGRAM COMPLETE   has swelling of legs, get echo. Take 40 lasix in am and 20 in pm       Problem List Items Addressed This Visit  Cardiovascular and Mediastinum   Hypertension - Primary   Relevant Orders   PCV ECHOCARDIOGRAM COMPLETE   PVD (peripheral vascular disease) (HCC)   Relevant Orders   PCV ECHOCARDIOGRAM COMPLETE     Other   Morbid obesity (HCC)   Relevant Orders   PCV ECHOCARDIOGRAM COMPLETE   Mixed hyperlipidemia   Relevant Orders   PCV ECHOCARDIOGRAM COMPLETE   Other Visit Diagnoses       Edema, unspecified type       Relevant Orders   PCV ECHOCARDIOGRAM COMPLETE     CHF (congestive heart failure), NYHA class III, acute on chronic, diastolic (HCC)       has swelling of legs, get echo. Take 40 lasix in am and 20 in pm   Relevant Orders   PCV ECHOCARDIOGRAM COMPLETE          Disposition:   Return in about 2 months (around 02/22/2024) for echo and f/u.    Total time spent: 30 minutes  Signed,  Adrian Blackwater, MD  12/25/2023 11:19 AM    [facility]

## 2024-01-09 ENCOUNTER — Encounter: Payer: Self-pay | Admitting: Internal Medicine

## 2024-01-10 ENCOUNTER — Inpatient Hospital Stay: Payer: Medicare Other | Admitting: Internal Medicine

## 2024-01-10 ENCOUNTER — Encounter: Payer: Self-pay | Admitting: Internal Medicine

## 2024-01-10 ENCOUNTER — Inpatient Hospital Stay: Payer: Medicare Other | Attending: Internal Medicine

## 2024-01-10 VITALS — BP 140/65 | HR 69 | Temp 97.6°F | Resp 14 | Wt 299.0 lb

## 2024-01-10 DIAGNOSIS — I110 Hypertensive heart disease with heart failure: Secondary | ICD-10-CM | POA: Insufficient documentation

## 2024-01-10 DIAGNOSIS — Z7982 Long term (current) use of aspirin: Secondary | ICD-10-CM | POA: Diagnosis not present

## 2024-01-10 DIAGNOSIS — D509 Iron deficiency anemia, unspecified: Secondary | ICD-10-CM | POA: Insufficient documentation

## 2024-01-10 DIAGNOSIS — I2510 Atherosclerotic heart disease of native coronary artery without angina pectoris: Secondary | ICD-10-CM | POA: Diagnosis not present

## 2024-01-10 DIAGNOSIS — Z79899 Other long term (current) drug therapy: Secondary | ICD-10-CM | POA: Diagnosis not present

## 2024-01-10 DIAGNOSIS — I5032 Chronic diastolic (congestive) heart failure: Secondary | ICD-10-CM | POA: Insufficient documentation

## 2024-01-10 DIAGNOSIS — E538 Deficiency of other specified B group vitamins: Secondary | ICD-10-CM | POA: Diagnosis present

## 2024-01-10 DIAGNOSIS — Z87891 Personal history of nicotine dependence: Secondary | ICD-10-CM | POA: Diagnosis not present

## 2024-01-10 DIAGNOSIS — Z794 Long term (current) use of insulin: Secondary | ICD-10-CM | POA: Diagnosis not present

## 2024-01-10 DIAGNOSIS — D649 Anemia, unspecified: Secondary | ICD-10-CM

## 2024-01-10 LAB — CBC WITH DIFFERENTIAL/PLATELET
Abs Immature Granulocytes: 0.05 10*3/uL (ref 0.00–0.07)
Basophils Absolute: 0.1 10*3/uL (ref 0.0–0.1)
Basophils Relative: 1 %
Eosinophils Absolute: 0.3 10*3/uL (ref 0.0–0.5)
Eosinophils Relative: 3 %
HCT: 32.9 % — ABNORMAL LOW (ref 36.0–46.0)
Hemoglobin: 9.8 g/dL — ABNORMAL LOW (ref 12.0–15.0)
Immature Granulocytes: 1 %
Lymphocytes Relative: 27 %
Lymphs Abs: 2.6 10*3/uL (ref 0.7–4.0)
MCH: 21.1 pg — ABNORMAL LOW (ref 26.0–34.0)
MCHC: 29.8 g/dL — ABNORMAL LOW (ref 30.0–36.0)
MCV: 70.9 fL — ABNORMAL LOW (ref 80.0–100.0)
Monocytes Absolute: 0.6 10*3/uL (ref 0.1–1.0)
Monocytes Relative: 6 %
Neutro Abs: 5.9 10*3/uL (ref 1.7–7.7)
Neutrophils Relative %: 62 %
Platelets: 267 10*3/uL (ref 150–400)
RBC: 4.64 MIL/uL (ref 3.87–5.11)
RDW: 18.7 % — ABNORMAL HIGH (ref 11.5–15.5)
WBC: 9.5 10*3/uL (ref 4.0–10.5)
nRBC: 0 % (ref 0.0–0.2)

## 2024-01-10 LAB — FERRITIN: Ferritin: 351 ng/mL — ABNORMAL HIGH (ref 11–307)

## 2024-01-10 LAB — IRON AND TIBC
Iron: 45 ug/dL (ref 28–170)
Saturation Ratios: 16 % (ref 10.4–31.8)
TIBC: 288 ug/dL (ref 250–450)
UIBC: 243 ug/dL

## 2024-01-10 LAB — COMPREHENSIVE METABOLIC PANEL
ALT: 15 U/L (ref 0–44)
AST: 21 U/L (ref 15–41)
Albumin: 3.8 g/dL (ref 3.5–5.0)
Alkaline Phosphatase: 58 U/L (ref 38–126)
Anion gap: 11 (ref 5–15)
BUN: 31 mg/dL — ABNORMAL HIGH (ref 8–23)
CO2: 25 mmol/L (ref 22–32)
Calcium: 8.8 mg/dL — ABNORMAL LOW (ref 8.9–10.3)
Chloride: 102 mmol/L (ref 98–111)
Creatinine, Ser: 1.11 mg/dL — ABNORMAL HIGH (ref 0.44–1.00)
GFR, Estimated: 54 mL/min — ABNORMAL LOW (ref >60)
Glucose, Bld: 128 mg/dL — ABNORMAL HIGH (ref 70–99)
Potassium: 3.6 mmol/L (ref 3.5–5.1)
Sodium: 138 mmol/L (ref 135–145)
Total Bilirubin: 0.4 mg/dL (ref 0.0–1.2)
Total Protein: 7.9 g/dL (ref 6.5–8.1)

## 2024-01-10 LAB — VITAMIN B12: Vitamin B-12: 2162 pg/mL — ABNORMAL HIGH (ref 180–914)

## 2024-01-10 NOTE — Progress Notes (Signed)
  [facility]  [phone] [phone]) [phone]  ID: Julie Sawyer OB: October 31, 1956  MR#: [id number]  ION#:[id number]  [name], MD as PCP - General (Family Medicine) [name], MD as Consulting Physician (Oncology)  Reason for visit-alpha Thal minor, vitamin B12 deficiency,  HPI: Julie Sawyer is a 68 y.o. 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  Patient reports longstanding history of anemia.  On lab review, dating back to 2014 patient baseline hemoglobin has been between 9-10.  With MCV of 69-71.  Platelets are normal and WBC normal.   Has diagnosis of alpha Thal minor S/p vitamin B12 injection x 5.  Last dose in February 2024.  On oral B12 supplements  Follows with vascular for PVD and ectatic abdominal aorta. Follows with Dr. Welton Flakes, cardiology for CHF, hypertension, CAD on Lasix  Interval history Patient seen today as follow-up for anemia, labs, vitamin B12 deficiency Overall she has been feeling well.  Denies any recent illnesses or hospitalizations.  Taking her B12 supplements.  REVIEW OF SYSTEMS:   ROS  As per HPI. Otherwise, a complete review of systems is negative.  PAST MEDICAL HISTORY: Past Medical History:  Diagnosis Date   Anemia    Arthritis    Bursitis    Coronary artery disease    Diabetes mellitus without complication (HCC)    Elevated lipids    Gout    Hypertension    Neuropathy    Obesity    Shortness of breath dyspnea    Sleep apnea    use CPAP   Vaginal bleeding     PAST SURGICAL HISTORY: Past Surgical History:  Procedure Laterality Date   BREAST BIOPSY Left    neg   BREAST SURGERY Left    CATARACT EXTRACTION W/PHACO Right 06/03/2020   Procedure: CATARACT EXTRACTION PHACO AND INTRAOCULAR LENS PLACEMENT (IOC) RIGHT DIABETIC;   Surgeon: Elliot Cousin, MD;  Location: [facility];  Service: Ophthalmology;  Laterality: Right;  Lot # [id number] H US;00:20.0 CDE: 2.69   CATARACT EXTRACTION W/PHACO Left 07/23/2020   Procedure: CATARACT EXTRACTION PHACO AND INTRAOCULAR LENS PLACEMENT (IOC) LEFT VISION BLUE DIABETIC;  Surgeon: Elliot Cousin, MD;  Location: [facility];  Service: Ophthalmology;  Laterality: Left;  [place] 00:17.5 CDE 2.97 AP% 8.2 Fluid Pack Lot # [id number]   CESAREAN SECTION  1989   DILATATION & CURETTAGE/HYSTEROSCOPY WITH MYOSURE N/A 08/28/2017   Procedure: DILATATION & CURETTAGE/HYSTEROSCOPY WITH MYOSURE;  Surgeon: Conard Novak, MD;  Location: [facility];  Service: Gynecology;  Laterality: N/A;   DILATION AND CURETTAGE OF UTERUS     ENDOMETRIAL ABLATION N/A 08/28/2017   Procedure: ENDOMETRIAL POLYPECTOMY;  Surgeon: Conard Novak, MD;  Location: [facility];  Service: Gynecology;  Laterality: N/A;   TONSILLECTOMY     TUBAL LIGATION      FAMILY HISTORY: Family History  Adopted: Yes    HEALTH MAINTENANCE: Social History   Tobacco Use   Smoking status: Former    Current packs/day: 0.00    Average packs/day: 0.8 packs/day for 20.0 years (15.0 ttl pk-yrs)    Types: Cigarettes    Start date: 11/13/1977    Quit date: 11/13/1997    Years since quitting: 26.1   Smokeless tobacco: Never  Vaping Use   Vaping status: Never Used  Substance Use Topics   Alcohol use: No   Drug  use: No     Allergies  Allergen Reactions   Penicillins Other (See Comments)    Childhood reaction.    Current Outpatient Medications  Medication Sig Dispense Refill   aspirin EC 81 MG tablet Take 81 mg by mouth daily. Swallow whole.     azelastine (ASTELIN) 0.1 % nasal spray Place into both nostrils.     carvedilol (COREG) 6.25 MG tablet Take 1 tablet (6.25 mg total) by mouth 2 (two) times daily. 180 tablet 3   cloNIDine (CATAPRES) 0.1 MG tablet TAKE 1 TABLET BY MOUTH TWICE DAILY 180 tablet 0   cyclobenzaprine (FLEXERIL) 5 MG tablet  Take 5 mg by mouth at bedtime as needed.     furosemide (LASIX) 20 MG tablet TAKE 1 TABLET(20 MG) BY MOUTH TWICE DAILY 60 tablet 0   gabapentin (NEURONTIN) 300 MG capsule Take by mouth.     hydrALAZINE (APRESOLINE) 100 MG tablet TAKE 1 TABLET BY MOUTH THREE TIMES DAILY 270 tablet 1   indomethacin (INDOCIN) 50 MG capsule Take 50 mg by mouth 2 (two) times daily as needed for moderate pain (pain (gout)).  0   LANTUS SOLOSTAR 100 UNIT/ML Solostar Pen SMARTSIG:60 Unit(s) SUB-Q Twice Daily     levocetirizine (XYZAL) 5 MG tablet Take 1 tablet by mouth daily.     losartan (COZAAR) 100 MG tablet Take 100 mg by mouth daily.     OZEMPIC, 0.25 OR 0.5 MG/DOSE, 2 MG/3ML SOPN Inject into the skin.     pentoxifylline (TRENTAL) 400 MG CR tablet TAKE 1 TABLET BY MOUTH EVERY DAY 30 tablet 3   pentoxifylline (TRENTAL) 400 MG CR tablet TAKE 1 TABLET BY MOUTH EVERY DAY 30 tablet 3   rosuvastatin (CRESTOR) 40 MG tablet Take 1 tablet (40 mg total) by mouth daily. 30 tablet 2   vitamin B-12 (CYANOCOBALAMIN) 100 MCG tablet Take 100 mcg by mouth daily.     No current facility-administered medications for this visit.    OBJECTIVE: Vitals:   01/10/24 1108  BP: (!) 140/65  Pulse: 69  Resp: 14  Temp: 97.6 F (36.4 C)  SpO2: 99%      Body mass index is 60.39 kg/m.      General: Well-developed, well-nourished, no acute distress. Eyes: Pink conjunctiva, anicteric sclera. HEENT: Normocephalic, moist mucous membranes, clear oropharnyx. Lungs: Clear to auscultation bilaterally. Heart: Regular rate and rhythm. No rubs, murmurs, or gallops. Abdomen: Soft, nontender, nondistended. No organomegaly noted, normoactive bowel sounds. Musculoskeletal: No edema, cyanosis, or clubbing. Neuro: Alert, answering all questions appropriately. Cranial nerves grossly intact. Skin: No rashes or petechiae noted. Psych: Normal affect. Lymphatics: No cervical, calvicular, axillary or inguinal LAD.   LAB RESULTS:  Lab Results   Component Value Date   NA 138 01/10/2024   K 3.6 01/10/2024   CL 102 01/10/2024   CO2 25 01/10/2024   GLUCOSE 128 (H) 01/10/2024   BUN 31 (H) 01/10/2024   CREATININE 1.11 (H) 01/10/2024   CALCIUM 8.8 (L) 01/10/2024   PROT 7.9 01/10/2024   ALBUMIN 3.8 01/10/2024   AST 21 01/10/2024   ALT 15 01/10/2024   ALKPHOS 58 01/10/2024   BILITOT 0.4 01/10/2024   GFRNONAA 54 (L) 01/10/2024   GFRAA >60 07/31/2017    Lab Results  Component Value Date   WBC 9.5 01/10/2024   NEUTROABS 5.9 01/10/2024   HGB 9.8 (L) 01/10/2024   HCT 32.9 (L) 01/10/2024   MCV 70.9 (L) 01/10/2024   PLT 267 01/10/2024    Lab Results  Component Value Date   TIBC 288 01/10/2024   TIBC 248 (L) 07/10/2023   TIBC 263 09/25/2022   FERRITIN 351 (H) 01/10/2024   FERRITIN 356 (H) 07/10/2023   FERRITIN 434 (H) 09/25/2022   IRONPCTSAT 16 01/10/2024   IRONPCTSAT 20 07/10/2023   IRONPCTSAT 22 09/25/2022     STUDIES: No results found.  ASSESSMENT AND PLAN:   Julie Sawyer is a 68 y.o. 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  Has been maintaining hemoglobin between 9-10. Has not required EPO.  -Iron panel normal.  Ferritin slightly elevated at 350.  B12 pending.  Hemoglobin today stable at 9.8.  SPEP/IFE no M protein.  Polyclonal increase in immunoglobulins.  Kappa 53, lambda 28 with ratio of 1.86.  # B12 deficiency -B12 from 09/26/2022 141. S/p IM B12 injection last in February 2024.  Now on B12 supplements 1000 mcg daily. -B12 level pending from today.  # Chronic diastolic heart failure -Follows with Dr. Welton Flakes, cardiology on Lasix.  # PVD -Follows with vascular  # Hypertension -On Coreg, hydralazine,  clonidine  # Hyperlipidemia-on rosuvastatin  Orders Placed This Encounter  Procedures   CBC with Differential (Cancer Center Only)   Vitamin B12   Ferritin   CMP (Cancer Center only)   Iron and TIBC([facility])    RTC in 1 year for MD visit, labs.  Patient expressed understanding and was in agreement with this plan. She also understands that She can call clinic at any time with any questions, concerns, or complaints.   I spent a total of 25 minutes reviewing chart data, face-to-face evaluation with the patient, counseling and coordination of care as detailed above.  [name], MD   01/10/2024 3:25 PM

## 2024-01-10 NOTE — Progress Notes (Signed)
 Patient is still having some shortness of breath, other than that she is doing well.

## 2024-01-15 ENCOUNTER — Ambulatory Visit (INDEPENDENT_AMBULATORY_CARE_PROVIDER_SITE_OTHER): Payer: Medicare Other | Admitting: Nurse Practitioner

## 2024-01-15 ENCOUNTER — Ambulatory Visit (INDEPENDENT_AMBULATORY_CARE_PROVIDER_SITE_OTHER): Payer: Medicare Other

## 2024-01-15 ENCOUNTER — Encounter (INDEPENDENT_AMBULATORY_CARE_PROVIDER_SITE_OTHER): Payer: Self-pay | Admitting: Nurse Practitioner

## 2024-01-15 VITALS — BP 186/71 | HR 73 | Resp 18 | Ht 59.0 in | Wt 299.0 lb

## 2024-01-15 DIAGNOSIS — I77811 Abdominal aortic ectasia: Secondary | ICD-10-CM | POA: Diagnosis not present

## 2024-01-15 DIAGNOSIS — I10 Essential (primary) hypertension: Secondary | ICD-10-CM | POA: Diagnosis not present

## 2024-01-15 DIAGNOSIS — R609 Edema, unspecified: Secondary | ICD-10-CM

## 2024-01-15 DIAGNOSIS — I739 Peripheral vascular disease, unspecified: Secondary | ICD-10-CM | POA: Diagnosis not present

## 2024-01-15 NOTE — Progress Notes (Signed)
 Subjective:    Patient ID: Julie Sawyer, female    DOB: 12/16/1955, 68 y.o.   MRN: [id number] Chief Complaint  Patient presents with   Follow-up    1 year follow up with ABI & Aorta    Julie Sawyer is a 68 year old female that returns today for follow-up evaluation of peripheral vascular disease.  She notes that she continues to have some swelling and it is variable.  She has been doing more water aerobics but during the winter the pool is cooler so she has not been going every day but as the weather improves she plans on going daily.  She denies typical claudication-like symptoms but does have significant arthritis.  Currently there is no rest pain or open wounds or ulcerations.   Today noninvasive studies show an ABI of 1.09 on the right and 1.01 on the left.  She has normal TBI's with good toe waveforms and good triphasic waveforms bilaterally.  The patient also had an aortoiliac duplex with improved velocities in the bilateral iliac arteries and a 3 cm ectatic aorta.    Review of Systems  Cardiovascular:  Positive for leg swelling.  Musculoskeletal:  Positive for arthralgias and gait problem.  All other systems reviewed and are negative.      Objective:   Physical Exam Vitals reviewed.  HENT:     Head: Normocephalic.  Cardiovascular:     Rate and Rhythm: Normal rate.     Pulses:          Dorsalis pedis pulses are 1+ on the right side and 1+ on the left side.  Pulmonary:     Effort: Pulmonary effort is normal.  Musculoskeletal:     Right lower leg: 1+ Edema present.     Left lower leg: 1+ Edema present.  Neurological:     Mental Status: She is alert and oriented to person, place, and time.  Psychiatric:        Mood and Affect: Mood normal.        Behavior: Behavior normal.        Thought Content: Thought content normal.        Judgment: Judgment normal.     BP (!) 186/71   Pulse 73   Resp 18   Ht 4\' 11"  (1.499 m)   Wt 299 lb (135.6 kg)   BMI 60.39 kg/m    Past Medical History:  Diagnosis Date   Anemia    Arthritis    Bursitis    Coronary artery disease    Diabetes mellitus without complication (HCC)    Elevated lipids    Gout    Hypertension    Neuropathy    Obesity    Shortness of breath dyspnea    Sleep apnea    use CPAP   Vaginal bleeding     Social History   Socioeconomic History   Marital status: Married    Spouse name: Not on file   Number of children: Not on file   Years of education: Not on file   Highest education level: Not on file  Occupational History   Not on file  Tobacco Use   Smoking status: Former    Current packs/day: 0.00    Average packs/day: 0.8 packs/day for 20.0 years (15.0 ttl pk-yrs)    Types: Cigarettes    Start date: 11/13/1977    Quit date: 11/13/1997    Years since quitting: 26.1   Smokeless tobacco: Never  Vaping Use   Vaping  status: Never Used  Substance and Sexual Activity   Alcohol use: No   Drug use: No   Sexual activity: Not on file  Other Topics Concern   Not on file  Social History Narrative   Not on file   Social Drivers of Health   Financial Resource Strain: Low Risk  (10/16/2023)   Received from [facility]   Overall Financial Resource Strain (CARDIA)    Difficulty of Paying Living Expenses: Not very hard  Food Insecurity: No Food Insecurity (10/16/2023)   Received from [facility]   Hunger Vital Sign    Worried About Running Out of Food in the Last Year: Never true    Ran Out of Food in the Last Year: Never true  Transportation Needs: No Transportation Needs (10/16/2023)   Received from [facility]    In the past 12 months, has lack of transportation kept you from medical appointments or from getting medications?: No    Lack of Transportation (Non-Medical): No  Physical Activity: Not on file  Stress: Not on file  Social Connections: Not on file  Intimate Partner Violence: Not on file     Past Surgical History:  Procedure Laterality Date   BREAST BIOPSY Left    neg   BREAST SURGERY Left    CATARACT EXTRACTION W/PHACO Right 06/03/2020   Procedure: CATARACT EXTRACTION PHACO AND INTRAOCULAR LENS PLACEMENT (IOC) RIGHT DIABETIC;  Surgeon: Elliot Cousin, MD;  Location: [facility];  Service: Ophthalmology;  Laterality: Right;  Lot # [id number] H US;00:20.0 CDE: 2.69   CATARACT EXTRACTION W/PHACO Left 07/23/2020   Procedure: CATARACT EXTRACTION PHACO AND INTRAOCULAR LENS PLACEMENT (IOC) LEFT VISION BLUE DIABETIC;  Surgeon: Elliot Cousin, MD;  Location: [facility];  Service: Ophthalmology;  Laterality: Left;  [place] 00:17.5 CDE 2.97 AP% 8.2 Fluid Pack Lot # [id number]   CESAREAN SECTION  1989   DILATATION & CURETTAGE/HYSTEROSCOPY WITH MYOSURE N/A 08/28/2017   Procedure: DILATATION & CURETTAGE/HYSTEROSCOPY WITH MYOSURE;  Surgeon: Conard Novak, MD;  Location: [facility];  Service: Gynecology;  Laterality: N/A;   DILATION AND CURETTAGE OF UTERUS     ENDOMETRIAL ABLATION N/A 08/28/2017   Procedure: ENDOMETRIAL POLYPECTOMY;  Surgeon: Conard Novak, MD;  Location: [facility];  Service: Gynecology;  Laterality: N/A;   TONSILLECTOMY     TUBAL LIGATION      Family History  Adopted: Yes    Allergies  Allergen Reactions   Penicillins Other (See Comments)    Childhood reaction.       Latest Ref Rng & Units 01/10/2024   10:31 AM 07/10/2023   10:25 AM 01/09/2023    2:52 PM  CBC  WBC 4.0 - 10.5 K/uL 9.5  12.4  11.0   Hemoglobin 12.0 - 15.0 g/dL 9.8  9.5  9.6   Hematocrit 36.0 - 46.0 % 32.9  31.8  32.3   Platelets 150 - 400 K/uL 267  279  302       CMP     Component Value Date/Time   NA 138 01/10/2024 1031   NA 143 05/14/2023 1017   NA 138 03/17/2013 1001   K 3.6 01/10/2024 1031   K 3.6 03/17/2013 1001   CL 102 01/10/2024 1031   CL 104 03/17/2013 1001   CO2 25 01/10/2024 1031   CO2 27 03/17/2013 1001   GLUCOSE 128 (H) 01/10/2024 1031   GLUCOSE 104 (H) 03/17/2013 1001    BUN 31 (H) 01/10/2024  1031   BUN 32 (H) 05/14/2023 1017   BUN 20 (H) 03/17/2013 1001   CREATININE 1.11 (H) 01/10/2024 1031   CREATININE 0.95 03/17/2013 1001   CALCIUM 8.8 (L) 01/10/2024 1031   CALCIUM 9.5 03/17/2013 1001   PROT 7.9 01/10/2024 1031   ALBUMIN 3.8 01/10/2024 1031   AST 21 01/10/2024 1031   ALT 15 01/10/2024 1031   ALKPHOS 58 01/10/2024 1031   BILITOT 0.4 01/10/2024 1031   GFRNONAA 54 (L) 01/10/2024 1031   GFRNONAA >60 03/17/2013 1001   GFRAA >60 07/31/2017 0954   GFRAA >60 03/17/2013 1001     No results found.     Assessment & Plan:   1. PVD (peripheral vascular disease) (HCC) Elevated velocities noted in the iliac arteries are improved from last year but she has previously had noted greater than 50% stenoses.  However since the patient is not having significant claudication-like symptoms or rest pain we will continue with close follow-up.  The patient is advised that if she begins to have claudication symptoms that occur regularly and begin to prohibit her life she should contact us sooner.  Otherwise we will have the patient return in 1 year for noninvasive studies  2. Hypertension, unspecified type Continue antihypertensive medications as already ordered, these medications have been reviewed and there are no changes at this time.  Blood pressure elevated today as she has not taken her medication yet.   3. Edema, unspecified type The edema is much improved.  Patient is encouraged to continue with water aerobics.  We discussed conservative therapy.  Patient will utilize medical grade compression stockings daily.  In addition she is advised to elevate her lower extremities when possible.  She should also continue with activity as this will help her edema.  4. Ectatic abdominal aorta (HCC) I had a long discussion with the patient regarding an ectatic aorta and how this relates to an aortic aneurysm.  Typically ectatic aorta is or not at risk for rupture but they can  develop into aortic aneurysms.  Because we are aware we will continue to follow this by annually   Current Outpatient Medications on File Prior to Visit  Medication Sig Dispense Refill   aspirin EC 81 MG tablet Take 81 mg by mouth daily. Swallow whole.     azelastine (ASTELIN) 0.1 % nasal spray Place into both nostrils.     carvedilol (COREG) 6.25 MG tablet Take 1 tablet (6.25 mg total) by mouth 2 (two) times daily. 180 tablet 3   cloNIDine (CATAPRES) 0.1 MG tablet TAKE 1 TABLET BY MOUTH TWICE DAILY 180 tablet 0   cyclobenzaprine (FLEXERIL) 5 MG tablet Take 5 mg by mouth at bedtime as needed.     furosemide (LASIX) 20 MG tablet TAKE 1 TABLET(20 MG) BY MOUTH TWICE DAILY 60 tablet 0   gabapentin (NEURONTIN) 300 MG capsule Take by mouth.     hydrALAZINE (APRESOLINE) 100 MG tablet TAKE 1 TABLET BY MOUTH THREE TIMES DAILY 270 tablet 1   indomethacin (INDOCIN) 50 MG capsule Take 50 mg by mouth 2 (two) times daily as needed for moderate pain (pain (gout)).  0   LANTUS SOLOSTAR 100 UNIT/ML Solostar Pen SMARTSIG:60 Unit(s) SUB-Q Twice Daily     levocetirizine (XYZAL) 5 MG tablet Take 1 tablet by mouth daily.     losartan (COZAAR) 100 MG tablet Take 100 mg by mouth daily.     OZEMPIC, 0.25 OR 0.5 MG/DOSE, 2 MG/3ML SOPN Inject into the skin.  pentoxifylline (TRENTAL) 400 MG CR tablet TAKE 1 TABLET BY MOUTH EVERY DAY 30 tablet 3   pentoxifylline (TRENTAL) 400 MG CR tablet TAKE 1 TABLET BY MOUTH EVERY DAY 30 tablet 3   rosuvastatin (CRESTOR) 40 MG tablet Take 1 tablet (40 mg total) by mouth daily. 30 tablet 2   vitamin B-12 (CYANOCOBALAMIN) 100 MCG tablet Take 500 mcg by mouth daily. 3 time a week     No current facility-administered medications on file prior to visit.    There are no Patient Instructions on file for this visit. No follow-ups on file.   Georgiana Spinner, NP

## 2024-01-16 ENCOUNTER — Other Ambulatory Visit: Payer: Self-pay | Admitting: Cardiovascular Disease

## 2024-01-16 DIAGNOSIS — I739 Peripheral vascular disease, unspecified: Secondary | ICD-10-CM

## 2024-01-16 DIAGNOSIS — I10 Essential (primary) hypertension: Secondary | ICD-10-CM

## 2024-01-18 LAB — VAS US ABI WITH/WO TBI
Left ABI: 1.01
Right ABI: 1.09

## 2024-01-19 ENCOUNTER — Other Ambulatory Visit: Payer: Self-pay | Admitting: Cardiovascular Disease

## 2024-01-25 ENCOUNTER — Encounter: Payer: Self-pay | Admitting: Internal Medicine

## 2024-02-01 ENCOUNTER — Encounter: Payer: Self-pay | Admitting: Internal Medicine

## 2024-02-01 ENCOUNTER — Ambulatory Visit (INDEPENDENT_AMBULATORY_CARE_PROVIDER_SITE_OTHER): Payer: Medicare Other

## 2024-02-01 ENCOUNTER — Other Ambulatory Visit: Payer: Medicare Other

## 2024-02-01 ENCOUNTER — Other Ambulatory Visit: Payer: Self-pay | Admitting: Cardiovascular Disease

## 2024-02-01 DIAGNOSIS — I361 Nonrheumatic tricuspid (valve) insufficiency: Secondary | ICD-10-CM

## 2024-02-01 DIAGNOSIS — I10 Essential (primary) hypertension: Secondary | ICD-10-CM

## 2024-02-01 DIAGNOSIS — I371 Nonrheumatic pulmonary valve insufficiency: Secondary | ICD-10-CM | POA: Diagnosis not present

## 2024-02-01 DIAGNOSIS — E782 Mixed hyperlipidemia: Secondary | ICD-10-CM

## 2024-02-01 DIAGNOSIS — I5033 Acute on chronic diastolic (congestive) heart failure: Secondary | ICD-10-CM

## 2024-02-01 DIAGNOSIS — I340 Nonrheumatic mitral (valve) insufficiency: Secondary | ICD-10-CM | POA: Diagnosis not present

## 2024-02-01 DIAGNOSIS — I739 Peripheral vascular disease, unspecified: Secondary | ICD-10-CM

## 2024-02-01 DIAGNOSIS — R609 Edema, unspecified: Secondary | ICD-10-CM

## 2024-02-15 ENCOUNTER — Encounter: Payer: Self-pay | Admitting: Internal Medicine

## 2024-02-22 ENCOUNTER — Encounter: Payer: Self-pay | Admitting: Cardiovascular Disease

## 2024-02-22 ENCOUNTER — Ambulatory Visit: Payer: Medicare Other | Admitting: Cardiovascular Disease

## 2024-02-22 ENCOUNTER — Encounter: Payer: Self-pay | Admitting: Internal Medicine

## 2024-02-22 VITALS — BP 149/78 | HR 75 | Ht 59.0 in | Wt 303.0 lb

## 2024-02-22 DIAGNOSIS — R0602 Shortness of breath: Secondary | ICD-10-CM

## 2024-02-22 DIAGNOSIS — I739 Peripheral vascular disease, unspecified: Secondary | ICD-10-CM

## 2024-02-22 DIAGNOSIS — I5033 Acute on chronic diastolic (congestive) heart failure: Secondary | ICD-10-CM | POA: Diagnosis not present

## 2024-02-22 DIAGNOSIS — E782 Mixed hyperlipidemia: Secondary | ICD-10-CM

## 2024-02-22 DIAGNOSIS — R609 Edema, unspecified: Secondary | ICD-10-CM | POA: Diagnosis not present

## 2024-02-22 DIAGNOSIS — Z0130 Encounter for examination of blood pressure without abnormal findings: Secondary | ICD-10-CM

## 2024-02-22 NOTE — Progress Notes (Addendum)
 Cardiology Office Note   Date:  02/22/2024   ID:  Julie Sawyer, DOB [birth year], MRN [id number]  PCP:  Emogene Morgan, MD  Cardiologist:  Adrian Blackwater, MD      History of Present Illness: Julie Sawyer is a 68 y.o. female who presents for  Chief Complaint  Patient presents with   Follow-up    2 month follow up  ECHO results    Doing well      Past Medical History:  Diagnosis Date   Anemia    Arthritis    Bursitis    Coronary artery disease    Diabetes mellitus without complication (HCC)    Elevated lipids    Gout    Hypertension    Neuropathy    Obesity    Shortness of breath dyspnea    Sleep apnea    use CPAP   Vaginal bleeding      Past Surgical History:  Procedure Laterality Date   BREAST BIOPSY Left    neg   BREAST SURGERY Left    CATARACT EXTRACTION W/PHACO Right 06/03/2020   Procedure: CATARACT EXTRACTION PHACO AND INTRAOCULAR LENS PLACEMENT (IOC) RIGHT DIABETIC;  Surgeon: Elliot Cousin, MD;  Location: [facility];  Service: Ophthalmology;  Laterality: Right;  Lot # [id number] H US;00:20.0 CDE: 2.69   CATARACT EXTRACTION W/PHACO Left 07/23/2020   Procedure: CATARACT EXTRACTION PHACO AND INTRAOCULAR LENS PLACEMENT (IOC) LEFT VISION BLUE DIABETIC;  Surgeon: Elliot Cousin, MD;  Location: [facility];  Service: Ophthalmology;  Laterality: Left;  [place] 00:17.5 CDE 2.97 AP% 8.2 Fluid Pack Lot # [id number] H   CESAREAN SECTION  1989   DILATATION & CURETTAGE/HYSTEROSCOPY WITH MYOSURE N/A 08/28/2017   Procedure: DILATATION & CURETTAGE/HYSTEROSCOPY WITH MYOSURE;  Surgeon: Conard Novak, MD;  Location: [facility];  Service: Gynecology;  Laterality: N/A;   DILATION AND CURETTAGE OF UTERUS     ENDOMETRIAL ABLATION N/A 08/28/2017   Procedure: ENDOMETRIAL POLYPECTOMY;  Surgeon: Conard Novak, MD;  Location: [facility];  Service: Gynecology;  Laterality: N/A;   TONSILLECTOMY     TUBAL LIGATION       Current Outpatient Medications  Medication Sig Dispense Refill    aspirin EC 81 MG tablet Take 81 mg by mouth daily. Swallow whole.     azelastine (ASTELIN) 0.1 % nasal spray Place into both nostrils.     carvedilol (COREG) 6.25 MG tablet Take 1 tablet (6.25 mg total) by mouth 2 (two) times daily. 180 tablet 3   cloNIDine (CATAPRES) 0.1 MG tablet TAKE 1 TABLET BY MOUTH TWICE DAILY 180 tablet 0   cyclobenzaprine (FLEXERIL) 5 MG tablet Take 5 mg by mouth at bedtime as needed.     furosemide (LASIX) 20 MG tablet TAKE 1 TABLET(20 MG) BY MOUTH TWICE DAILY 60 tablet 0   gabapentin (NEURONTIN) 300 MG capsule Take by mouth.     hydrALAZINE (APRESOLINE) 100 MG tablet TAKE 1 TABLET BY MOUTH THREE TIMES DAILY 270 tablet 1   indomethacin (INDOCIN) 50 MG capsule Take 50 mg by mouth 2 (two) times daily as needed for moderate pain (pain (gout)).  0   LANTUS SOLOSTAR 100 UNIT/ML Solostar Pen SMARTSIG:60 Unit(s) SUB-Q Twice Daily     levocetirizine (XYZAL) 5 MG tablet Take 1 tablet by mouth daily.     losartan (COZAAR) 100 MG tablet Take 100 mg by mouth daily.     OZEMPIC, 0.25 OR 0.5 MG/DOSE, 2 MG/3ML SOPN Inject into the skin.     pentoxifylline (TRENTAL)  400 MG CR tablet TAKE 1 TABLET BY MOUTH EVERY DAY 30 tablet 3   pentoxifylline (TRENTAL) 400 MG CR tablet TAKE 1 TABLET BY MOUTH EVERY DAY 30 tablet 3   pentoxifylline (TRENTAL) 400 MG CR tablet TAKE 1 TABLET BY MOUTH EVERY DAY 30 tablet 3   rosuvastatin (CRESTOR) 40 MG tablet TAKE 1 TABLET(40 MG) BY MOUTH DAILY 30 tablet 2   vitamin B-12 (CYANOCOBALAMIN) 100 MCG tablet Take 500 mcg by mouth daily. 3 time a week     No current facility-administered medications for this visit.    Allergies:   Penicillins    Social History:   reports that she quit smoking about 26 years ago. Her smoking use included cigarettes. She started smoking about 46 years ago. She has a 15 pack-year smoking history. She has never used smokeless tobacco. She reports that she does not drink alcohol and does not use drugs.   Family History:  family  history is not on file. She was adopted.    ROS:     Review of Systems  Constitutional: Negative.   HENT: Negative.    Eyes: Negative.   Respiratory: Negative.    Gastrointestinal: Negative.   Genitourinary: Negative.   Musculoskeletal: Negative.   Skin: Negative.   Neurological: Negative.   Endo/Heme/Allergies: Negative.   Psychiatric/Behavioral: Negative.    All other systems reviewed and are negative.     All other systems are reviewed and negative.    PHYSICAL EXAM: VS:  BP (!) 149/78   Pulse 75   Ht 4\' 11"  (1.499 m)   Wt (!) 303 lb (137.4 kg)   SpO2 97%   BMI 61.20 kg/m  , BMI Body mass index is 61.2 kg/m. Last weight:  Wt Readings from Last 3 Encounters:  02/22/24 (!) 303 lb (137.4 kg)  01/15/24 299 lb (135.6 kg)  01/10/24 299 lb (135.6 kg)     Physical Exam Constitutional:      Appearance: Normal appearance.  Cardiovascular:     Rate and Rhythm: Normal rate and regular rhythm.     Heart sounds: Normal heart sounds.  Pulmonary:     Effort: Pulmonary effort is normal.     Breath sounds: Normal breath sounds.  Musculoskeletal:     Right lower leg: No edema.     Left lower leg: No edema.  Neurological:     Mental Status: She is alert.       EKG:   Recent Labs: 01/10/2024: ALT 15; BUN 31; Creatinine, Ser 1.11; Hemoglobin 9.8; Platelets 267; Potassium 3.6; Sodium 138    Lipid Panel No results found for: "CHOL", "TRIG", "HDL", "CHOLHDL", "VLDL", "LDLCALC", "LDLDIRECT"    Other studies Reviewed: Additional studies/ records that were reviewed today include:  Review of the above records demonstrates:       No data to display            ASSESSMENT AND PLAN:    ICD-10-CM   1. SOB (shortness of breath)  R06.02    Doing well, no SOB, exercizing.LVEF 66%, mild MR/TR/PR    2. Edema, unspecified type  R60.9     3. CHF (congestive heart failure), NYHA class III, acute on chronic, diastolic (HCC)  I50.33     4. Mixed hyperlipidemia  E78.2      5. Morbid obesity (HCC)  E66.01     6. PVD (peripheral vascular disease) (HCC)  I73.9        Problem List Items Addressed This Visit  Cardiovascular and Mediastinum   PVD (peripheral vascular disease) (HCC)     Other   Morbid obesity (HCC)   Mixed hyperlipidemia   Other Visit Diagnoses       SOB (shortness of breath)    -  Primary   Doing well, no SOB, exercizing.LVEF 66%, mild MR/TR/PR     Edema, unspecified type         CHF (congestive heart failure), NYHA class III, acute on chronic, diastolic (HCC)              Disposition:   Return in about 3 months (around 05/23/2024).    Total time spent: 35 minutes  Signed,  Adrian Blackwater, MD  02/22/2024 10:41 AM    [facility]

## 2024-02-23 ENCOUNTER — Other Ambulatory Visit: Payer: Self-pay | Admitting: Cardiovascular Disease

## 2024-03-02 NOTE — Progress Notes (Signed)
 [facility] [address] [place], [place] [address]  Pulmonary Sleep Medicine   Office Visit Note  Patient Name: Julie Sawyer DOB: 01/02/1956 MRN [id number]    Chief Complaint: Obstructive Sleep Apnea visit  Brief History:  Larra is seen today for an annual follow up visit for CPAP@ 12 cmH2O. The patient has a 10 year history of sleep apnea. Patient is using PAP nightly.  The patient feels rested after sleeping with PAP.  The patient reports benefiting from PAP use. Reported sleepiness is  improved and the Epworth Sleepiness Score is 11 out of 24. The patient will seldom take naps. The patient complains of the following: none.  The compliance download shows 100% compliance with an average use time of 9 hours 49 minutes. The AHI is 0.7.  The patient does not complain of limb movements disrupting sleep. The patient continues to require PAP therapy in order to eliminate sleep apnea.   ROS  General: (-) fever, (-) chills, (-) night sweat Nose and Sinuses: (-) nasal stuffiness or itchiness, (-) postnasal drip, (-) nosebleeds, (-) sinus trouble. Mouth and Throat: (-) sore throat, (-) hoarseness. Neck: (-) swollen glands, (-) enlarged thyroid, (-) neck pain. Respiratory: - cough, - shortness of breath, - wheezing. Neurologic: - numbness, + tingling in hands, occasionally in feet.  Psychiatric: - anxiety, - depression   Current Medication: Outpatient Encounter Medications as of 03/03/2024  Medication Sig   RYALTRIS 665-25 MCG/ACT SUSP Place into both nostrils.   aspirin EC 81 MG tablet Take 81 mg by mouth daily. Swallow whole.   azelastine (ASTELIN) 0.1 % nasal spray Place into both nostrils.   carvedilol  (COREG ) 6.25 MG tablet Take 1 tablet (6.25 mg total) by mouth 2 (two) times daily.   cloNIDine (CATAPRES) 0.1 MG tablet TAKE 1 TABLET BY MOUTH TWICE DAILY   cyclobenzaprine  (FLEXERIL ) 5 MG tablet Take 5 mg by mouth at bedtime as needed.   furosemide  (LASIX ) 20 MG tablet  TAKE 1 TABLET(20 MG) BY MOUTH TWICE DAILY   gabapentin (NEURONTIN) 300 MG capsule Take by mouth.   hydrALAZINE  (APRESOLINE ) 100 MG tablet TAKE 1 TABLET BY MOUTH THREE TIMES DAILY   indomethacin (INDOCIN) 50 MG capsule Take 50 mg by mouth 2 (two) times daily as needed for moderate pain (pain (gout)).   LANTUS SOLOSTAR 100 UNIT/ML Solostar Pen SMARTSIG:60 Unit(s) SUB-Q Twice Daily   levocetirizine (XYZAL) 5 MG tablet Take 1 tablet by mouth daily.   losartan (COZAAR) 100 MG tablet Take 100 mg by mouth daily.   OZEMPIC, 0.25 OR 0.5 MG/DOSE, 2 MG/3ML SOPN Inject into the skin.   pentoxifylline (TRENTAL) 400 MG CR tablet TAKE 1 TABLET BY MOUTH EVERY DAY   rosuvastatin  (CRESTOR ) 40 MG tablet TAKE 1 TABLET(40 MG) BY MOUTH DAILY   vitamin B-12 (CYANOCOBALAMIN ) 100 MCG tablet Take 500 mcg by mouth daily. 3 time a week   [DISCONTINUED] pentoxifylline (TRENTAL) 400 MG CR tablet TAKE 1 TABLET BY MOUTH EVERY DAY   [DISCONTINUED] pentoxifylline (TRENTAL) 400 MG CR tablet TAKE 1 TABLET BY MOUTH EVERY DAY   No facility-administered encounter medications on file as of 03/03/2024.    Surgical History: Past Surgical History:  Procedure Laterality Date   BREAST BIOPSY Left    neg   BREAST SURGERY Left    CATARACT EXTRACTION W/PHACO Right 06/03/2020   Procedure: CATARACT EXTRACTION PHACO AND INTRAOCULAR LENS PLACEMENT (IOC) RIGHT DIABETIC;  Surgeon: Ola Berger, MD;  Location: [facility];  Service: Ophthalmology;  Laterality: Right;  Lot # [id number] H US ;00:20.0 CDE: 2.69   CATARACT EXTRACTION W/PHACO Left 07/23/2020   Procedure: CATARACT EXTRACTION PHACO AND INTRAOCULAR LENS PLACEMENT (IOC) LEFT VISION BLUE DIABETIC;  Surgeon: Ola Berger, MD;  Location: [facility];  Service: Ophthalmology;  Laterality: Left;  US  00:17.5 CDE 2.97 AP% 8.2 Fluid Pack Lot # [id number]   CESAREAN SECTION  1989   DILATATION & CURETTAGE/HYSTEROSCOPY WITH MYOSURE N/A 08/28/2017   Procedure: DILATATION & CURETTAGE/HYSTEROSCOPY WITH  MYOSURE;  Surgeon: Kris Pester, MD;  Location: [facility];  Service: Gynecology;  Laterality: N/A;   DILATION AND CURETTAGE OF UTERUS     ENDOMETRIAL ABLATION N/A 08/28/2017   Procedure: ENDOMETRIAL POLYPECTOMY;  Surgeon: Kris Pester, MD;  Location: [facility];  Service: Gynecology;  Laterality: N/A;   TONSILLECTOMY     TUBAL LIGATION      Medical History: Past Medical History:  Diagnosis Date   Anemia    Arthritis    Bursitis    Coronary artery disease    Diabetes mellitus without complication (HCC)    Elevated lipids    Gout    Hypertension    Neuropathy    Obesity    Shortness of breath dyspnea    Sleep apnea    use CPAP   Vaginal bleeding     Family History: Non contributory to the present illness  Social History: Social History   Socioeconomic History   Marital status: Married    Spouse name: Not on file   Number of children: Not on file   Years of education: Not on file   Highest education level: Not on file  Occupational History   Not on file  Tobacco Use   Smoking status: Former    Current packs/day: 0.00    Average packs/day: 0.8 packs/day for 20.0 years (15.0 ttl pk-yrs)    Types: Cigarettes    Start date: 11/13/1977    Quit date: 11/13/1997    Years since quitting: 26.3   Smokeless tobacco: Never  Vaping Use   Vaping status: Never Used  Substance and Sexual Activity   Alcohol use: No   Drug use: No   Sexual activity: Not on file  Other Topics Concern   Not on file  Social History Narrative   Not on file   Social Drivers of Health   Financial Resource Strain: Low Risk  (10/16/2023)   Received from [facility]   Overall Financial Resource Strain (CARDIA)    Difficulty of Paying Living Expenses: Not very hard  Food Insecurity: No Food Insecurity (10/16/2023)   [facility] from [facility]   Hunger Vital Sign    Worried About Running Out of Food in the Last Year: Never true    Ran Out of Food in the  Last Year: Never true  Transportation Needs: No Transportation Needs (10/16/2023)   Received from [facility] - Transportation    In the past 12 months, has lack of transportation kept you from medical appointments or from getting medications?: No    Lack of Transportation (Non-Medical): No  Physical Activity: Not on file  Stress: Not on file  Social Connections: Not on file  Intimate Partner Violence: Not on file    Vital Signs: Blood pressure (!) 154/63, pulse 69, resp. rate 16, height 4\' 11"  (1.499 m), weight 298 lb (135.2 kg), SpO2 98%. Body mass index is 60.19 kg/m.    Examination: General Appearance: The patient is well-developed, well-nourished, and in  no distress. Neck Circumference: 45 cm Skin: Gross inspection of skin unremarkable. Head: normocephalic, no gross deformities. Eyes: no gross deformities noted. ENT: ears appear grossly normal Neurologic: Alert and oriented. No involuntary movements.  STOP BANG RISK ASSESSMENT S (snore) Have you been told that you snore?     NO   T (tired) Are you often tired, fatigued, or sleepy during the day?   NO  O (obstruction) Do you stop breathing, choke, or gasp during sleep? NO   P (pressure) Do you have or are you being treated for high blood pressure? YES   B (BMI) Is your body index greater than 35 kg/m? YES   A (age) Are you 18 years old or older? YES   N (neck) Do you have a neck circumference greater than 16 inches?   YES   G (gender) Are you a female? NO   TOTAL STOP/BANG "YES" ANSWERS 5       A STOP-Bang score of 2 or less is considered low risk, and a score of 5 or more is high risk for having either moderate or severe OSA. For people who score 3 or 4, doctors may need to perform further assessment to determine how likely they are to have OSA.         EPWORTH SLEEPINESS SCALE:  Scale:  (0)= no chance of dozing; (1)= slight chance of dozing; (2)= moderate chance of dozing; (3)= high  chance of dozing  Chance  Situtation    Sitting and reading: 3    Watching TV: 1    Sitting Inactive in public: 1    As a passenger in car: 1      Lying down to rest: 1    Sitting and talking: 1    Sitting quielty after lunch: 2    In a car, stopped in traffic: 1   TOTAL SCORE:   11 out of 24    SLEEP STUDIES:  Split (01/2014) AHI 24.4/hr, right lateral AHI 45.8, min SpO2 845, recommend CPAP@ 12 cmH2O   CPAP COMPLIANCE DATA:  Date Range: 02/27/2023-02/26/2024  Average Daily Use: 9 hours 49 minutes  Median Use: 9 hours 38 minutes  Compliance for > 4 Hours: 100%  AHI: 0.7 respiratory events per hour  Days Used: 365/365 days  Mask Leak: 39.6  95th Percentile Pressure: 12         LABS: Recent Results (from the past 2160 hours)  Comprehensive metabolic panel     Status: Abnormal   Collection Time: 01/10/24 10:31 AM  Result Value Ref Range   Sodium 138 135 - 145 mmol/L   Potassium 3.6 3.5 - 5.1 mmol/L   Chloride 102 98 - 111 mmol/L   CO2 25 22 - 32 mmol/L   Glucose, Bld 128 (H) 70 - 99 mg/dL    Comment: Glucose reference range applies only to samples taken after fasting for at least 8 hours.   BUN 31 (H) 8 - 23 mg/dL   Creatinine, Ser 1.61 (H) 0.44 - 1.00 mg/dL   Calcium  8.8 (L) 8.9 - 10.3 mg/dL   Total Protein 7.9 6.5 - 8.1 g/dL   Albumin 3.8 3.5 - 5.0 g/dL   AST 21 15 - 41 U/L   ALT 15 0 - 44 U/L   Alkaline Phosphatase 58 38 - 126 U/L   Total Bilirubin 0.4 0.0 - 1.2 mg/dL   GFR, Estimated 54 (L) >60 mL/min    Comment: (NOTE) Calculated using the CKD-EPI Creatinine Equation (2021)  Anion gap 11 5 - 15    Comment: Performed at [facility], [address] [place], [place] [address]  CBC with Differential/Platelet     Status: Abnormal   Collection Time: 01/10/24 10:31 AM  Result Value Ref Range   WBC 9.5 4.0 - 10.5 K/uL   RBC 4.64 3.87 - 5.11 MIL/uL   Hemoglobin 9.8 (L) 12.0 - 15.0 g/dL    Comment: Reticulocyte Hemoglobin  testing may be clinically indicated, consider ordering this additional test [id number]    HCT 32.9 (L) 36.0 - 46.0 %   MCV 70.9 (L) 80.0 - 100.0 fL   MCH 21.1 (L) 26.0 - 34.0 pg   MCHC 29.8 (L) 30.0 - 36.0 g/dL   RDW 06.3 (H) 01.6 - 01.0 %   Platelets 267 150 - 400 K/uL   nRBC 0.0 0.0 - 0.2 %   Neutrophils Relative % 62 %   Neutro Abs 5.9 1.7 - 7.7 K/uL   Lymphocytes Relative 27 %   Lymphs Abs 2.6 0.7 - 4.0 K/uL   Monocytes Relative 6 %   Monocytes Absolute 0.6 0.1 - 1.0 K/uL   Eosinophils Relative 3 %   Eosinophils Absolute 0.3 0.0 - 0.5 K/uL   Basophils Relative 1 %   Basophils Absolute 0.1 0.0 - 0.1 K/uL   Immature Granulocytes 1 %   Abs Immature Granulocytes 0.05 0.00 - 0.07 K/uL    Comment: Performed at [facility], [address] [place], [place] [address]  Ferritin     Status: Abnormal   Collection Time: 01/10/24 10:31 AM  Result Value Ref Range   Ferritin 351 (H) 11 - 307 ng/mL    Comment: Performed at [facility], [address] [place], [place] [address]  Iron and TIBC([facility])     Status: None   Collection Time: 01/10/24 10:31 AM  Result Value Ref Range   Iron 45 28 - 170 ug/dL   TIBC 025 427 - 062 ug/dL   Saturation Ratios 16 10.4 - 31.8 %   UIBC 243 ug/dL    Comment: Performed at [facility], [address] [place], [place] [address]  Vitamin B12     Status: Abnormal   Collection Time: 01/10/24 10:32 AM  Result Value Ref Range   Vitamin B-12 2,162 (H) 180 - 914 pg/mL    Comment: (NOTE) This assay is not validated for testing neonatal or myeloproliferative syndrome specimens for Vitamin B12 levels. Performed at [facility], [address], [place], [place] [address]   VAS US  ABI WITH/WO TBI     Status: None   Collection Time: 01/15/24  8:33 AM  Result Value Ref Range   Right ABI 1.09    Left ABI 1.01     Radiology: MM 3D SCREENING MAMMOGRAM BILATERAL BREAST Result Date: 08/03/2023 CLINICAL DATA:   Screening. EXAM: DIGITAL SCREENING BILATERAL MAMMOGRAM WITH TOMOSYNTHESIS AND CAD TECHNIQUE: Bilateral screening digital craniocaudal and mediolateral oblique mammograms were obtained. Bilateral screening digital breast tomosynthesis was performed. The images were evaluated with computer-aided detection. COMPARISON:  Previous exam(s). ACR Breast Density Category b: There are scattered areas of fibroglandular density. FINDINGS: There are no findings suspicious for malignancy. IMPRESSION: No mammographic evidence of malignancy. A result letter of this screening mammogram will be mailed directly to the patient. RECOMMENDATION: Screening mammogram in one year. (Code:[id number]-B-01Y) BI-RADS CATEGORY  1: Negative. Electronically Signed   By: Alinda Apley M.D.   On: 08/03/2023 08:44    No results  found.  No results found.    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 OSA on CPAP (Primary) The patient does tolerate PAP and reports  benefit from PAP use. The patient was reminded how to clean equipment and advised to replace supplies routinely. The patient was also counselled on weight loss. We discussed GLP-1 treatment as an adjunct in treatment of her OSA. She will discuss the possibility of tirzepatide with her doctor. She has been on ozempic but has not seen much weight loss. The compliance is excellent. The AHI is 0.7.   OSA on cpap- controlled.  Continue with excellent compliance with pap. CPAP continues to be medically necessary to treat this patient's OSA. F/u one year.    2. CPAP use counseling CPAP Counseling: had a lengthy discussion with the patient regarding the importance of PAP therapy in management of the sleep apnea. Patient appears to understand the risk factor reduction and also understands the risks associated with untreated sleep apnea. Patient will try to make a good faith effort to remain compliant with therapy. Also instructed the patient on proper cleaning of the device including the water must be changed daily if possible and use of distilled water is preferred. Patient understands that the machine should be regularly cleaned with appropriate recommended cleaning solutions that do not damage the PAP machine for example given white vinegar and water rinses. Other methods such as ozone treatment may not be as good as these simple methods to achieve cleaning.   3. Morbid obesity (HCC) Obesity Counseling: Had a lengthy discussion regarding patients BMI and weight issues. Patient was instructed on portion control as well as increased activity. Also discussed caloric restrictions with trying to maintain intake less than 2000 Kcal. Discussions were made in accordance with the 5As of weight management. Simple actions such as not eating late and if able to, taking a walk is suggested.   4. Hypertension, unspecified type Controlled with hydralazine , carvedilol ,  clonidine and losartan, continue.     General Counseling: I have discussed the findings of the evaluation and examination with Wallene Gum.  I have also discussed any further diagnostic evaluation thatmay be needed or ordered today. Orli verbalizes understanding of the findings of todays visit. We also reviewed her medications today and discussed drug interactions and side effects including but not limited excessive drowsiness and altered mental states. We also discussed that there  is always a risk not just to her but also people around her. she has been encouraged to call the office with any questions or concerns that should arise related to todays visit.  No orders of the defined types were placed in this encounter.       I have personally obtained a history, examined the patient, evaluated laboratory and imaging results, formulated the assessment and plan and placed orders. This patient was seen today by Louann Rous, PA-C in collaboration with Dr. Cam Cava.   Cordie Deters, MD [facility] Diplomate  AB[facility] Pulmonary Critical Care Medicine and Sleep Medicine

## 2024-03-03 ENCOUNTER — Ambulatory Visit (INDEPENDENT_AMBULATORY_CARE_PROVIDER_SITE_OTHER): Admitting: Internal Medicine

## 2024-03-03 VITALS — BP 154/63 | HR 69 | Resp 16 | Ht 59.0 in | Wt 298.0 lb

## 2024-03-03 DIAGNOSIS — I10 Essential (primary) hypertension: Secondary | ICD-10-CM | POA: Diagnosis not present

## 2024-03-03 DIAGNOSIS — Z7189 Other specified counseling: Secondary | ICD-10-CM | POA: Diagnosis not present

## 2024-03-03 DIAGNOSIS — G4733 Obstructive sleep apnea (adult) (pediatric): Secondary | ICD-10-CM | POA: Diagnosis not present

## 2024-03-03 NOTE — Patient Instructions (Signed)

## 2024-03-06 ENCOUNTER — Other Ambulatory Visit: Payer: Self-pay | Admitting: Cardiovascular Disease

## 2024-03-28 ENCOUNTER — Other Ambulatory Visit: Payer: Self-pay | Admitting: Cardiovascular Disease

## 2024-04-24 ENCOUNTER — Other Ambulatory Visit: Payer: Self-pay

## 2024-04-24 DIAGNOSIS — I10 Essential (primary) hypertension: Secondary | ICD-10-CM

## 2024-04-25 MED ORDER — HYDRALAZINE HCL 100 MG PO TABS: 100.0000 mg | ORAL_TABLET | Freq: Three times a day (TID) | ORAL | 1 refills | Status: DC

## 2024-04-28 ENCOUNTER — Other Ambulatory Visit: Payer: Self-pay | Admitting: Cardiovascular Disease

## 2024-05-02 ENCOUNTER — Other Ambulatory Visit: Payer: Self-pay | Admitting: Cardiovascular Disease

## 2024-05-02 DIAGNOSIS — I10 Essential (primary) hypertension: Secondary | ICD-10-CM

## 2024-05-03 ENCOUNTER — Other Ambulatory Visit: Payer: Self-pay | Admitting: Cardiovascular Disease

## 2024-05-03 DIAGNOSIS — I10 Essential (primary) hypertension: Secondary | ICD-10-CM

## 2024-05-13 ENCOUNTER — Encounter: Payer: Self-pay | Admitting: Internal Medicine

## 2024-05-20 ENCOUNTER — Encounter: Payer: Self-pay | Admitting: Internal Medicine

## 2024-05-20 ENCOUNTER — Encounter: Payer: Self-pay | Admitting: Cardiovascular Disease

## 2024-05-20 ENCOUNTER — Ambulatory Visit (INDEPENDENT_AMBULATORY_CARE_PROVIDER_SITE_OTHER): Admitting: Cardiovascular Disease

## 2024-05-20 VITALS — BP 120/68 | HR 79 | Ht 59.0 in | Wt 305.0 lb

## 2024-05-20 DIAGNOSIS — I10 Essential (primary) hypertension: Secondary | ICD-10-CM

## 2024-05-20 DIAGNOSIS — I739 Peripheral vascular disease, unspecified: Secondary | ICD-10-CM

## 2024-05-20 DIAGNOSIS — E782 Mixed hyperlipidemia: Secondary | ICD-10-CM

## 2024-05-20 DIAGNOSIS — R609 Edema, unspecified: Secondary | ICD-10-CM

## 2024-05-20 DIAGNOSIS — I5033 Acute on chronic diastolic (congestive) heart failure: Secondary | ICD-10-CM | POA: Diagnosis not present

## 2024-05-20 DIAGNOSIS — R0602 Shortness of breath: Secondary | ICD-10-CM

## 2024-05-20 MED ORDER — PENTOXIFYLLINE ER 400 MG PO TBCR
400.0000 mg | EXTENDED_RELEASE_TABLET | Freq: Every day | ORAL | 3 refills | Status: DC
Start: 2024-05-20 — End: 2024-08-18

## 2024-05-20 NOTE — Progress Notes (Signed)
 Cardiology Office Note   Date:  05/20/2024   ID:  Julie Sawyer, DOB Jul 17, 1956, MRN [id number]  PCP:  Lorel Maxie LABOR, MD  Cardiologist:  Denyse Bathe, MD      History of Present Illness: Julie Sawyer is a 68 y.o. female who presents for  Chief Complaint  Patient presents with   Follow-up    3 month    Doing well, no chest pains and SOB.      Past Medical History:  Diagnosis Date   Anemia    Arthritis    Bursitis    Coronary artery disease    Diabetes mellitus without complication (HCC)    Elevated lipids    Gout    Hypertension    Neuropathy    Obesity    Shortness of breath dyspnea    Sleep apnea    use CPAP   Vaginal bleeding      Past Surgical History:  Procedure Laterality Date   BREAST BIOPSY Left    neg   BREAST SURGERY Left    CATARACT EXTRACTION W/PHACO Right 06/03/2020   Procedure: CATARACT EXTRACTION PHACO AND INTRAOCULAR LENS PLACEMENT (IOC) RIGHT DIABETIC;  Surgeon: Ferol Rogue, MD;  Location: [facility];  Service: Ophthalmology;  Laterality: Right;  Lot # [id number] H US ;00:20.0 CDE: 2.69   CATARACT EXTRACTION W/PHACO Left 07/23/2020   Procedure: CATARACT EXTRACTION PHACO AND INTRAOCULAR LENS PLACEMENT (IOC) LEFT VISION BLUE DIABETIC;  Surgeon: Ferol Rogue, MD;  Location: [facility];  Service: Ophthalmology;  Laterality: Left;  US  00:17.5 CDE 2.97 AP% 8.2 Fluid Pack Lot # [id number]   CESAREAN SECTION  1989   DILATATION & CURETTAGE/HYSTEROSCOPY WITH MYOSURE N/A 08/28/2017   Procedure: DILATATION & CURETTAGE/HYSTEROSCOPY WITH MYOSURE;  Surgeon: Leonce Garnette BIRCH, MD;  Location: [facility];  Service: Gynecology;  Laterality: N/A;   DILATION AND CURETTAGE OF UTERUS     ENDOMETRIAL ABLATION N/A 08/28/2017   Procedure: ENDOMETRIAL POLYPECTOMY;  Surgeon: Leonce Garnette BIRCH, MD;  Location: [facility];  Service: Gynecology;  Laterality: N/A;   TONSILLECTOMY     TUBAL LIGATION       Current Outpatient Medications  Medication Sig Dispense Refill    aspirin EC 81 MG tablet Take 81 mg by mouth daily. Swallow whole.     azelastine (ASTELIN) 0.1 % nasal spray Place into both nostrils.     carvedilol  (COREG ) 6.25 MG tablet TAKE 1 TABLET(6.25 MG) BY MOUTH TWICE DAILY 180 tablet 3   cloNIDine (CATAPRES) 0.1 MG tablet TAKE 1 TABLET BY MOUTH TWICE DAILY 180 tablet 0   cyclobenzaprine  (FLEXERIL ) 5 MG tablet Take 5 mg by mouth at bedtime as needed.     furosemide  (LASIX ) 20 MG tablet TAKE 1 TABLET(20 MG) BY MOUTH TWICE DAILY 60 tablet 0   gabapentin (NEURONTIN) 300 MG capsule Take by mouth.     hydrALAZINE  (APRESOLINE ) 100 MG tablet Take 1 tablet (100 mg total) by mouth 3 (three) times daily. 270 tablet 1   indomethacin (INDOCIN) 50 MG capsule Take 50 mg by mouth 2 (two) times daily as needed for moderate pain (pain (gout)).  0   LANTUS SOLOSTAR 100 UNIT/ML Solostar Pen SMARTSIG:60 Unit(s) SUB-Q Twice Daily     levocetirizine (XYZAL) 5 MG tablet Take 1 tablet by mouth daily.     losartan (COZAAR) 100 MG tablet Take 100 mg by mouth daily.     OZEMPIC, 0.25 OR 0.5 MG/DOSE, 2 MG/3ML SOPN Inject into the skin.     rosuvastatin  (CRESTOR )  40 MG tablet TAKE 1 TABLET(40 MG) BY MOUTH DAILY 30 tablet 2   RYALTRIS 665-25 MCG/ACT SUSP Place into both nostrils.     vitamin B-12 (CYANOCOBALAMIN ) 100 MCG tablet Take 500 mcg by mouth daily. 3 time a week     pentoxifylline  (TRENTAL ) 400 MG CR tablet Take 1 tablet (400 mg total) by mouth daily. 30 tablet 3   No current facility-administered medications for this visit.    Allergies:   Penicillins    Social History:   reports that she quit smoking about 26 years ago. Her smoking use included cigarettes. She started smoking about 46 years ago. She has a 15 pack-year smoking history. She has never used smokeless tobacco. She reports that she does not drink alcohol and does not use drugs.   Family History:  family history is not on file. She was adopted.    ROS:     Review of Systems  Constitutional: Negative.    HENT: Negative.    Eyes: Negative.   Respiratory: Negative.    Gastrointestinal: Negative.   Genitourinary: Negative.   Musculoskeletal: Negative.   Skin: Negative.   Neurological: Negative.   Endo/Heme/Allergies: Negative.   Psychiatric/Behavioral: Negative.    All other systems reviewed and are negative.     All other systems are reviewed and negative.    PHYSICAL EXAM: VS:  BP 120/68   Pulse 79   Ht 4' 11 (1.499 m)   Wt (!) 305 lb (138.3 kg)   SpO2 94%   BMI 61.60 kg/m  , BMI Body mass index is 61.6 kg/m. Last weight:  Wt Readings from Last 3 Encounters:  05/20/24 (!) 305 lb (138.3 kg)  03/03/24 298 lb (135.2 kg)  02/22/24 (!) 303 lb (137.4 kg)     Physical Exam Constitutional:      Appearance: Normal appearance.  Cardiovascular:     Rate and Rhythm: Normal rate and regular rhythm.     Heart sounds: Normal heart sounds.  Pulmonary:     Effort: Pulmonary effort is normal.     Breath sounds: Normal breath sounds.  Musculoskeletal:     Right lower leg: No edema.     Left lower leg: No edema.  Neurological:     Mental Status: She is alert.       EKG:   Recent Labs: 01/10/2024: ALT 15; BUN 31; Creatinine, Ser 1.11; Hemoglobin 9.8; Platelets 267; Potassium 3.6; Sodium 138    Lipid Panel No results found for: CHOL, TRIG, HDL, CHOLHDL, VLDL, LDLCALC, LDLDIRECT    Other studies Reviewed: Additional studies/ records that were reviewed today include:  Review of the above records demonstrates:       No data to display            ASSESSMENT AND PLAN:    ICD-10-CM   1. Edema, unspecified type  R60.9     2. CHF (congestive heart failure), NYHA class III, acute on chronic, diastolic (HCC)  I50.33     3. Mixed hyperlipidemia  E78.2     4. PVD (peripheral vascular disease) (HCC)  I73.9 pentoxifylline  (TRENTAL ) 400 MG CR tablet    5. SOB (shortness of breath)  R06.02    No SOB.    6. Primary hypertension  I10    stable        Problem List Items Addressed This Visit       Cardiovascular and Mediastinum   Hypertension   PVD (peripheral vascular disease) (HCC)   Relevant Medications  pentoxifylline  (TRENTAL ) 400 MG CR tablet     Other   Mixed hyperlipidemia   Other Visit Diagnoses       Edema, unspecified type    -  Primary     CHF (congestive heart failure), NYHA class III, acute on chronic, diastolic (HCC)         SOB (shortness of breath)       No SOB.          Disposition:   Return in about 3 months (around 08/20/2024).    Total time spent: 35 minutes  Signed,  Denyse Bathe, MD  05/20/2024 10:25 AM    [facility]

## 2024-05-22 ENCOUNTER — Ambulatory Visit: Admitting: Cardiovascular Disease

## 2024-05-27 ENCOUNTER — Other Ambulatory Visit: Payer: Self-pay | Admitting: Cardiovascular Disease

## 2024-06-23 ENCOUNTER — Other Ambulatory Visit: Payer: Self-pay | Admitting: Cardiology

## 2024-06-23 ENCOUNTER — Other Ambulatory Visit: Payer: Self-pay | Admitting: Cardiovascular Disease

## 2024-06-27 ENCOUNTER — Other Ambulatory Visit: Payer: Self-pay | Admitting: Cardiovascular Disease

## 2024-07-02 ENCOUNTER — Other Ambulatory Visit: Payer: Self-pay | Admitting: Family Medicine

## 2024-07-02 DIAGNOSIS — Z1231 Encounter for screening mammogram for malignant neoplasm of breast: Secondary | ICD-10-CM

## 2024-07-09 ENCOUNTER — Other Ambulatory Visit: Payer: Self-pay | Admitting: Cardiovascular Disease

## 2024-07-31 ENCOUNTER — Other Ambulatory Visit: Payer: Self-pay | Admitting: Cardiovascular Disease

## 2024-07-31 DIAGNOSIS — I10 Essential (primary) hypertension: Secondary | ICD-10-CM

## 2024-08-04 ENCOUNTER — Encounter

## 2024-08-08 ENCOUNTER — Ambulatory Visit
Admission: RE | Admit: 2024-08-08 | Discharge: 2024-08-08 | Disposition: A | Discharge status: Home / Self Care | Source: Ambulatory Visit | Attending: Family Medicine | Admitting: Family Medicine

## 2024-08-08 DIAGNOSIS — Z1231 Encounter for screening mammogram for malignant neoplasm of breast: Secondary | ICD-10-CM | POA: Insufficient documentation

## 2024-08-16 ENCOUNTER — Other Ambulatory Visit: Payer: Self-pay | Admitting: Cardiovascular Disease

## 2024-08-16 DIAGNOSIS — I739 Peripheral vascular disease, unspecified: Secondary | ICD-10-CM

## 2024-08-19 ENCOUNTER — Encounter: Payer: Self-pay | Admitting: Internal Medicine

## 2024-08-26 ENCOUNTER — Ambulatory Visit (INDEPENDENT_AMBULATORY_CARE_PROVIDER_SITE_OTHER): Admitting: Cardiovascular Disease

## 2024-08-26 ENCOUNTER — Encounter: Payer: Self-pay | Admitting: Cardiovascular Disease

## 2024-08-26 VITALS — BP 123/77 | HR 78 | Ht 59.0 in | Wt 300.2 lb

## 2024-08-26 DIAGNOSIS — I10 Essential (primary) hypertension: Secondary | ICD-10-CM | POA: Diagnosis not present

## 2024-08-26 DIAGNOSIS — E782 Mixed hyperlipidemia: Secondary | ICD-10-CM

## 2024-08-26 DIAGNOSIS — I739 Peripheral vascular disease, unspecified: Secondary | ICD-10-CM

## 2024-08-26 DIAGNOSIS — N1831 Chronic kidney disease, stage 3a: Secondary | ICD-10-CM | POA: Diagnosis not present

## 2024-08-26 DIAGNOSIS — I361 Nonrheumatic tricuspid (valve) insufficiency: Secondary | ICD-10-CM

## 2024-08-26 NOTE — Progress Notes (Signed)
 Cardiology Office Note   Date:  08/26/2024   ID:  Julie Sawyer, DOB 05-13-1956, MRN [id number]  PCP:  Lorel Maxie LABOR, MD  Cardiologist:  Denyse Bathe, MD      History of Present Illness: Julie Sawyer is a 68 y.o. female who presents for  Chief Complaint  Patient presents with   Follow-up    3 month follow up    Has SOB, and weight is high.      Past Medical History:  Diagnosis Date   Anemia    Arthritis    Bursitis    Coronary artery disease    Diabetes mellitus without complication (HCC)    Elevated lipids    Gout    Hypertension    Neuropathy    Obesity    Shortness of breath dyspnea    Sleep apnea    use CPAP   Vaginal bleeding      Past Surgical History:  Procedure Laterality Date   BREAST BIOPSY Left    neg   BREAST SURGERY Left    CATARACT EXTRACTION W/PHACO Right 06/03/2020   Procedure: CATARACT EXTRACTION PHACO AND INTRAOCULAR LENS PLACEMENT (IOC) RIGHT DIABETIC;  Surgeon: Ferol Rogue, MD;  Location: [facility];  Service: Ophthalmology;  Laterality: Right;  Lot # [id number] H US ;00:20.0 CDE: 2.69   CATARACT EXTRACTION W/PHACO Left 07/23/2020   Procedure: CATARACT EXTRACTION PHACO AND INTRAOCULAR LENS PLACEMENT (IOC) LEFT VISION BLUE DIABETIC;  Surgeon: Ferol Rogue, MD;  Location: [facility];  Service: Ophthalmology;  Laterality: Left;  US  00:17.5 CDE 2.97 AP% 8.2 Fluid Pack Lot # [id number]   CESAREAN SECTION  1989   DILATATION & CURETTAGE/HYSTEROSCOPY WITH MYOSURE N/A 08/28/2017   Procedure: DILATATION & CURETTAGE/HYSTEROSCOPY WITH MYOSURE;  Surgeon: Leonce Garnette BIRCH, MD;  Location: [facility];  Service: Gynecology;  Laterality: N/A;   DILATION AND CURETTAGE OF UTERUS     ENDOMETRIAL ABLATION N/A 08/28/2017   Procedure: ENDOMETRIAL POLYPECTOMY;  Surgeon: Leonce Garnette BIRCH, MD;  Location: [facility];  Service: Gynecology;  Laterality: N/A;   TONSILLECTOMY     TUBAL LIGATION       Current Outpatient Medications  Medication Sig Dispense  Refill   aspirin EC 81 MG tablet Take 81 mg by mouth daily. Swallow whole.     azelastine (ASTELIN) 0.1 % nasal spray Place into both nostrils.     carvedilol  (COREG ) 6.25 MG tablet TAKE 1 TABLET(6.25 MG) BY MOUTH TWICE DAILY 180 tablet 3   cloNIDine (CATAPRES) 0.1 MG tablet TAKE 1 TABLET BY MOUTH TWICE DAILY 180 tablet 0   cyclobenzaprine  (FLEXERIL ) 5 MG tablet Take 5 mg by mouth at bedtime as needed.     furosemide  (LASIX ) 20 MG tablet TAKE 1 TABLET(20 MG) BY MOUTH TWICE DAILY 60 tablet 4   gabapentin (NEURONTIN) 300 MG capsule Take by mouth.     hydrALAZINE  (APRESOLINE ) 100 MG tablet Take 1 tablet (100 mg total) by mouth 3 (three) times daily. 270 tablet 1   indomethacin (INDOCIN) 50 MG capsule Take 50 mg by mouth 2 (two) times daily as needed for moderate pain (pain (gout)).  0   LANTUS SOLOSTAR 100 UNIT/ML Solostar Pen SMARTSIG:60 Unit(s) SUB-Q Twice Daily     levocetirizine (XYZAL) 5 MG tablet Take 1 tablet by mouth daily.     losartan (COZAAR) 100 MG tablet Take 100 mg by mouth daily.     OZEMPIC, 0.25 OR 0.5 MG/DOSE, 2 MG/3ML SOPN Inject into the skin. (Patient taking differently: Inject 1 %  into the skin once a week.)     pentoxifylline  (TRENTAL ) 400 MG CR tablet TAKE 1 TABLET(400 MG) BY MOUTH DAILY 30 tablet 3   rosuvastatin  (CRESTOR ) 40 MG tablet TAKE 1 TABLET(40 MG) BY MOUTH DAILY 90 tablet 3   RYALTRIS 665-25 MCG/ACT SUSP Place into both nostrils.     vitamin B-12 (CYANOCOBALAMIN ) 100 MCG tablet Take 500 mcg by mouth daily. 3 time a week     No current facility-administered medications for this visit.    Allergies:   Penicillins    Social History:   reports that she quit smoking about 26 years ago. Her smoking use included cigarettes. She started smoking about 46 years ago. She has a 15 pack-year smoking history. She has never used smokeless tobacco. She reports that she does not drink alcohol and does not use drugs.   Family History:  family history is not on file. She was  adopted.    ROS:     Review of Systems  Constitutional: Negative.   HENT: Negative.    Eyes: Negative.   Respiratory: Negative.    Gastrointestinal: Negative.   Genitourinary: Negative.   Musculoskeletal: Negative.   Skin: Negative.   Neurological: Negative.   Endo/Heme/Allergies: Negative.   Psychiatric/Behavioral: Negative.    All other systems reviewed and are negative.     All other systems are reviewed and negative.    PHYSICAL EXAM: VS:  BP 123/77   Pulse 78   Ht 4' 11 (1.499 m)   Wt (!) 300 lb 3.2 oz (136.2 kg)   SpO2 94%   BMI 60.63 kg/m  , BMI Body mass index is 60.63 kg/m. Last weight:  Wt Readings from Last 3 Encounters:  08/26/24 (!) 300 lb 3.2 oz (136.2 kg)  05/20/24 (!) 305 lb (138.3 kg)  03/03/24 298 lb (135.2 kg)     Physical Exam Constitutional:      Appearance: Normal appearance.  Cardiovascular:     Rate and Rhythm: Normal rate and regular rhythm.     Heart sounds: Normal heart sounds.  Pulmonary:     Effort: Pulmonary effort is normal.     Breath sounds: Normal breath sounds.  Musculoskeletal:     Right lower leg: No edema.     Left lower leg: No edema.  Neurological:     Mental Status: She is alert.       EKG:   Recent Labs: 01/10/2024: ALT 15; BUN 31; Creatinine, Ser 1.11; Hemoglobin 9.8; Platelets 267; Potassium 3.6; Sodium 138    Lipid Panel No results found for: CHOL, TRIG, HDL, CHOLHDL, VLDL, LDLCALC, LDLDIRECT    Other studies Reviewed: Additional studies/ records that were reviewed today include:  Review of the above records demonstrates:       No data to display            ASSESSMENT AND PLAN:    ICD-10-CM   1. Primary hypertension  I10     2. PVD (peripheral vascular disease)  I73.9     3. Stage 3a chronic kidney disease (HCC)  N18.31     4. Morbid obesity (HCC)  E66.01    On ozympic now.    5. Mixed hyperlipidemia  E78.2     6. Nonrheumatic tricuspid valve regurgitation  I36.1     trace TR/PR       Problem List Items Addressed This Visit       Cardiovascular and Mediastinum   Hypertension - Primary   PVD (peripheral vascular  disease)     Genitourinary   CKD (chronic kidney disease) stage 3, GFR 30-59 ml/min (HCC)     Other   Morbid obesity (HCC)   Mixed hyperlipidemia   Other Visit Diagnoses       Nonrheumatic tricuspid valve regurgitation       trace TR/PR          Disposition:   Return in about 3 months (around 11/26/2024).    Total time spent: 30 minutes  Signed,  Denyse Bathe, MD  08/26/2024 9:52 AM    [facility]

## 2024-08-29 ENCOUNTER — Other Ambulatory Visit: Payer: Self-pay | Admitting: Cardiology

## 2024-08-29 DIAGNOSIS — I10 Essential (primary) hypertension: Secondary | ICD-10-CM

## 2024-11-04 ENCOUNTER — Other Ambulatory Visit: Payer: Self-pay | Admitting: Cardiovascular Disease

## 2024-11-04 DIAGNOSIS — I10 Essential (primary) hypertension: Secondary | ICD-10-CM

## 2024-11-25 ENCOUNTER — Ambulatory Visit (INDEPENDENT_AMBULATORY_CARE_PROVIDER_SITE_OTHER): Admitting: Cardiovascular Disease

## 2024-11-25 ENCOUNTER — Encounter: Payer: Self-pay | Admitting: Cardiovascular Disease

## 2024-11-25 VITALS — BP 132/60 | HR 88 | Ht 59.0 in | Wt 305.0 lb

## 2024-11-25 DIAGNOSIS — R0602 Shortness of breath: Secondary | ICD-10-CM | POA: Diagnosis not present

## 2024-11-25 DIAGNOSIS — R609 Edema, unspecified: Secondary | ICD-10-CM | POA: Diagnosis not present

## 2024-11-25 DIAGNOSIS — E782 Mixed hyperlipidemia: Secondary | ICD-10-CM

## 2024-11-25 DIAGNOSIS — G4733 Obstructive sleep apnea (adult) (pediatric): Secondary | ICD-10-CM

## 2024-11-25 DIAGNOSIS — I6523 Occlusion and stenosis of bilateral carotid arteries: Secondary | ICD-10-CM

## 2024-11-25 DIAGNOSIS — N1831 Chronic kidney disease, stage 3a: Secondary | ICD-10-CM | POA: Diagnosis not present

## 2024-11-25 DIAGNOSIS — I5033 Acute on chronic diastolic (congestive) heart failure: Secondary | ICD-10-CM

## 2024-11-25 DIAGNOSIS — I739 Peripheral vascular disease, unspecified: Secondary | ICD-10-CM | POA: Diagnosis not present

## 2024-11-25 DIAGNOSIS — I361 Nonrheumatic tricuspid (valve) insufficiency: Secondary | ICD-10-CM | POA: Diagnosis not present

## 2024-11-25 DIAGNOSIS — I10 Essential (primary) hypertension: Secondary | ICD-10-CM

## 2024-11-25 MED ORDER — CARVEDILOL 12.5 MG PO TABS: 12.5000 mg | ORAL_TABLET | Freq: Two times a day (BID) | ORAL | 11 refills | Status: AC

## 2024-11-25 NOTE — Progress Notes (Signed)
 "     Cardiology Office Note   Date:  11/25/2024   ID:  Julie Sawyer, DOB 28-Mar-1956, MRN [id number]  PCP:  Julie Maxie LABOR, MD  Cardiologist:  Denyse Bathe, MD      History of Present Illness: Julie Sawyer is a 69 y.o. female who presents for  Chief Complaint  Patient presents with   Follow-up    3 month follow up    DOING GOOD      Past Medical History:  Diagnosis Date   Anemia    Arthritis    Bursitis    Coronary artery disease    Diabetes mellitus without complication (HCC)    Elevated lipids    Gout    Hypertension    Neuropathy    Obesity    Shortness of breath dyspnea    Sleep apnea    use CPAP   Vaginal bleeding      Past Surgical History:  Procedure Laterality Date   BREAST BIOPSY Left    neg   BREAST SURGERY Left    CATARACT EXTRACTION W/PHACO Right 06/03/2020   Procedure: CATARACT EXTRACTION PHACO AND INTRAOCULAR LENS PLACEMENT (IOC) RIGHT DIABETIC;  Surgeon: Ferol Rogue, MD;  Location: [facility];  Service: Ophthalmology;  Laterality: Right;  Lot # [id number] H US ;00:20.0 CDE: 2.69   CATARACT EXTRACTION W/PHACO Left 07/23/2020   Procedure: CATARACT EXTRACTION PHACO AND INTRAOCULAR LENS PLACEMENT (IOC) LEFT VISION BLUE DIABETIC;  Surgeon: Ferol Rogue, MD;  Location: [facility];  Service: Ophthalmology;  Laterality: Left;  US  00:17.5 CDE 2.97 AP% 8.2 Fluid Pack Lot # [id number]   CESAREAN SECTION  1989   DILATATION & CURETTAGE/HYSTEROSCOPY WITH MYOSURE N/A 08/28/2017   Procedure: DILATATION & CURETTAGE/HYSTEROSCOPY WITH MYOSURE;  Surgeon: Leonce Garnette BIRCH, MD;  Location: [facility];  Service: Gynecology;  Laterality: N/A;   DILATION AND CURETTAGE OF UTERUS     ENDOMETRIAL ABLATION N/A 08/28/2017   Procedure: ENDOMETRIAL POLYPECTOMY;  Surgeon: Leonce Garnette BIRCH, MD;  Location: [facility];  Service: Gynecology;  Laterality: N/A;   TONSILLECTOMY     TUBAL LIGATION       Current Outpatient Medications  Medication Sig Dispense Refill   carvedilol   (COREG ) 12.5 MG tablet Take 1 tablet (12.5 mg total) by mouth 2 (two) times daily. 60 tablet 11   aspirin EC 81 MG tablet Take 81 mg by mouth daily. Swallow whole.     azelastine (ASTELIN) 0.1 % nasal spray Place into both nostrils.     cloNIDine (CATAPRES) 0.1 MG tablet TAKE 1 TABLET BY MOUTH TWICE DAILY 180 tablet 0   cyclobenzaprine  (FLEXERIL ) 5 MG tablet Take 5 mg by mouth at bedtime as needed.     furosemide  (LASIX ) 20 MG tablet TAKE 1 TABLET(20 MG) BY MOUTH TWICE DAILY 60 tablet 4   gabapentin (NEURONTIN) 300 MG capsule Take by mouth.     hydrALAZINE  (APRESOLINE ) 100 MG tablet TAKE 1 TABLET(100 MG) BY MOUTH THREE TIMES DAILY 270 tablet 1   indomethacin (INDOCIN) 50 MG capsule Take 50 mg by mouth 2 (two) times daily as needed for moderate pain (pain (gout)).  0   LANTUS SOLOSTAR 100 UNIT/ML Solostar Pen SMARTSIG:60 Unit(s) SUB-Q Twice Daily     levocetirizine (XYZAL) 5 MG tablet Take 1 tablet by mouth daily.     losartan (COZAAR) 100 MG tablet Take 100 mg by mouth daily.     OZEMPIC, 0.25 OR 0.5 MG/DOSE, 2 MG/3ML SOPN Inject into the skin. (Patient taking differently: Inject 1 %  into the skin once a week.)     pentoxifylline  (TRENTAL ) 400 MG CR tablet TAKE 1 TABLET(400 MG) BY MOUTH DAILY 30 tablet 3   rosuvastatin  (CRESTOR ) 40 MG tablet TAKE 1 TABLET(40 MG) BY MOUTH DAILY 90 tablet 3   RYALTRIS 665-25 MCG/ACT SUSP Place into both nostrils.     vitamin B-12 (CYANOCOBALAMIN ) 100 MCG tablet Take 500 mcg by mouth daily. 3 time a week     No current facility-administered medications for this visit.    Allergies:   Penicillins    Social History:   reports that she quit smoking about 27 years ago. Her smoking use included cigarettes. She started smoking about 47 years ago. She has a 15 pack-year smoking history. She has never used smokeless tobacco. She reports that she does not drink alcohol and does not use drugs.   Family History:  family history is not on file. She was adopted.    ROS:      Review of Systems  Constitutional: Negative.   HENT: Negative.    Eyes: Negative.   Respiratory: Negative.    Gastrointestinal: Negative.   Genitourinary: Negative.   Musculoskeletal: Negative.   Skin: Negative.   Neurological: Negative.   Endo/Heme/Allergies: Negative.   Psychiatric/Behavioral: Negative.    All other systems reviewed and are negative.     All other systems are reviewed and negative.    PHYSICAL EXAM: VS:  BP 132/60   Pulse 88   Ht 4' 11 (1.499 m)   Wt (!) 305 lb (138.3 kg)   SpO2 94%   BMI 61.60 kg/m  , BMI Body mass index is 61.6 kg/m. Last weight:  Wt Readings from Last 3 Encounters:  11/25/24 (!) 305 lb (138.3 kg)  08/26/24 (!) 300 lb 3.2 oz (136.2 kg)  05/20/24 (!) 305 lb (138.3 kg)     Physical Exam Constitutional:      Appearance: Normal appearance.  Cardiovascular:     Rate and Rhythm: Normal rate and regular rhythm.     Heart sounds: Normal heart sounds.  Pulmonary:     Effort: Pulmonary effort is normal.     Breath sounds: Normal breath sounds.  Musculoskeletal:     Right lower leg: No edema.     Left lower leg: No edema.  Neurological:     Mental Status: She is alert.       EKG:   Recent Labs: 01/10/2024: ALT 15; BUN 31; Creatinine, Ser 1.11; Hemoglobin 9.8; Platelets 267; Potassium 3.6; Sodium 138    Lipid Panel No results found for: CHOL, TRIG, HDL, CHOLHDL, VLDL, LDLCALC, LDLDIRECT    Other studies Reviewed: Additional studies/ records that were reviewed today include:  Review of the above records demonstrates:       No data to display            ASSESSMENT AND PLAN:    ICD-10-CM   1. Primary hypertension  I10 carvedilol  (COREG ) 12.5 MG tablet   INCREASE COREG  12.5 BID    2. PVD (peripheral vascular disease)  I73.9 carvedilol  (COREG ) 12.5 MG tablet    3. Stage 3a chronic kidney disease (HCC)  N18.31 carvedilol  (COREG ) 12.5 MG tablet    4. Morbid obesity (HCC)  E66.01 carvedilol   (COREG ) 12.5 MG tablet    5. Mixed hyperlipidemia  E78.2 carvedilol  (COREG ) 12.5 MG tablet    6. Nonrheumatic tricuspid valve regurgitation  I36.1 carvedilol  (COREG ) 12.5 MG tablet    7. Edema, unspecified type  R60.9 carvedilol  (COREG )  12.5 MG tablet    8. CHF (congestive heart failure), NYHA class III, acute on chronic, diastolic (HCC)  I50.33 carvedilol  (COREG ) 12.5 MG tablet    9. SOB (shortness of breath)  R06.02 carvedilol  (COREG ) 12.5 MG tablet    10. OSA on CPAP  G47.33 carvedilol  (COREG ) 12.5 MG tablet    11. Carotid stenosis, asymptomatic, bilateral  I65.23 carvedilol  (COREG ) 12.5 MG tablet       Problem List Items Addressed This Visit       Cardiovascular and Mediastinum   Hypertension - Primary   Relevant Medications   carvedilol  (COREG ) 12.5 MG tablet   PVD (peripheral vascular disease)   Relevant Medications   carvedilol  (COREG ) 12.5 MG tablet     Respiratory   OSA on CPAP   Relevant Medications   carvedilol  (COREG ) 12.5 MG tablet     Genitourinary   CKD (chronic kidney disease) stage 3, GFR 30-59 ml/min (HCC)   Relevant Medications   carvedilol  (COREG ) 12.5 MG tablet     Other   Morbid obesity (HCC)   Relevant Medications   carvedilol  (COREG ) 12.5 MG tablet   Mixed hyperlipidemia   Relevant Medications   carvedilol  (COREG ) 12.5 MG tablet   Other Visit Diagnoses       Nonrheumatic tricuspid valve regurgitation       Relevant Medications   carvedilol  (COREG ) 12.5 MG tablet     Edema, unspecified type       Relevant Medications   carvedilol  (COREG ) 12.5 MG tablet     CHF (congestive heart failure), NYHA class III, acute on chronic, diastolic (HCC)       Relevant Medications   carvedilol  (COREG ) 12.5 MG tablet     SOB (shortness of breath)       Relevant Medications   carvedilol  (COREG ) 12.5 MG tablet     Carotid stenosis, asymptomatic, bilateral       Relevant Medications   carvedilol  (COREG ) 12.5 MG tablet          Disposition:    Return in about 3 months (around 02/23/2025).    Total time spent: 35 minutes  Signed,  Denyse Bathe, MD  11/25/2024 10:36 AM    [facility] "

## 2024-12-03 ENCOUNTER — Other Ambulatory Visit: Payer: Self-pay | Admitting: Cardiovascular Disease

## 2024-12-03 DIAGNOSIS — I739 Peripheral vascular disease, unspecified: Secondary | ICD-10-CM

## 2025-01-09 ENCOUNTER — Ambulatory Visit: Payer: Medicare Other | Admitting: Oncology

## 2025-01-09 ENCOUNTER — Other Ambulatory Visit: Payer: Medicare Other

## 2025-01-14 ENCOUNTER — Encounter (INDEPENDENT_AMBULATORY_CARE_PROVIDER_SITE_OTHER)

## 2025-01-14 ENCOUNTER — Ambulatory Visit (INDEPENDENT_AMBULATORY_CARE_PROVIDER_SITE_OTHER): Admitting: Nurse Practitioner

## 2025-03-03 ENCOUNTER — Ambulatory Visit: Admitting: Cardiovascular Disease
